# Patient Record
Sex: Female | Born: 1960 | Race: Black or African American | Hispanic: No | Marital: Single | Smoking: Former smoker
Health system: Southern US, Community
[De-identification: ages and names within clinical notes are randomized; demographics above are authoritative.]

## PROBLEM LIST (undated history)

## (undated) DIAGNOSIS — J45909 Unspecified asthma, uncomplicated: Secondary | ICD-10-CM

## (undated) DIAGNOSIS — I10 Essential (primary) hypertension: Secondary | ICD-10-CM

## (undated) DIAGNOSIS — K759 Inflammatory liver disease, unspecified: Secondary | ICD-10-CM

## (undated) DIAGNOSIS — M1990 Unspecified osteoarthritis, unspecified site: Secondary | ICD-10-CM

## (undated) HISTORY — DX: Essential (primary) hypertension: I10

## (undated) HISTORY — DX: Inflammatory liver disease, unspecified: K75.9

## (undated) HISTORY — DX: Unspecified osteoarthritis, unspecified site: M19.90

---

## 1999-11-23 ENCOUNTER — Encounter: Payer: Self-pay | Admitting: Emergency Medicine

## 1999-11-23 ENCOUNTER — Emergency Department (HOSPITAL_COMMUNITY): Admission: EM | Admit: 1999-11-23 | Discharge: 1999-11-23 | Payer: Self-pay | Admitting: Emergency Medicine

## 1999-11-26 ENCOUNTER — Ambulatory Visit (HOSPITAL_COMMUNITY): Admission: RE | Admit: 1999-11-26 | Discharge: 1999-11-26 | Payer: Self-pay | Admitting: Emergency Medicine

## 1999-11-26 ENCOUNTER — Encounter: Payer: Self-pay | Admitting: Emergency Medicine

## 1999-12-19 ENCOUNTER — Encounter: Admission: RE | Admit: 1999-12-19 | Discharge: 2000-03-18 | Payer: Self-pay | Admitting: Neurosurgery

## 2000-11-12 ENCOUNTER — Ambulatory Visit (HOSPITAL_COMMUNITY): Admission: RE | Admit: 2000-11-12 | Discharge: 2000-11-12 | Payer: Self-pay | Admitting: Orthopedic Surgery

## 2000-11-12 ENCOUNTER — Encounter: Payer: Self-pay | Admitting: Orthopedic Surgery

## 2001-06-01 ENCOUNTER — Encounter: Admission: RE | Admit: 2001-06-01 | Discharge: 2001-06-01 | Payer: Self-pay | Admitting: Internal Medicine

## 2001-12-11 ENCOUNTER — Ambulatory Visit (HOSPITAL_COMMUNITY): Admission: RE | Admit: 2001-12-11 | Discharge: 2001-12-11 | Payer: Self-pay | Admitting: Orthopedic Surgery

## 2001-12-11 ENCOUNTER — Encounter: Payer: Self-pay | Admitting: Orthopedic Surgery

## 2002-01-26 ENCOUNTER — Encounter: Admission: RE | Admit: 2002-01-26 | Discharge: 2002-01-26 | Payer: Self-pay | Admitting: Orthopedic Surgery

## 2002-01-26 ENCOUNTER — Encounter: Payer: Self-pay | Admitting: Orthopedic Surgery

## 2002-02-17 ENCOUNTER — Encounter: Admission: RE | Admit: 2002-02-17 | Discharge: 2002-02-17 | Payer: Self-pay | Admitting: Orthopedic Surgery

## 2002-02-17 ENCOUNTER — Encounter: Payer: Self-pay | Admitting: Orthopedic Surgery

## 2002-04-30 ENCOUNTER — Inpatient Hospital Stay (HOSPITAL_COMMUNITY): Admission: RE | Admit: 2002-04-30 | Discharge: 2002-05-01 | Payer: Self-pay | Admitting: Orthopaedic Surgery

## 2002-11-28 ENCOUNTER — Emergency Department (HOSPITAL_COMMUNITY): Admission: EM | Admit: 2002-11-28 | Discharge: 2002-11-28 | Payer: Self-pay | Admitting: *Deleted

## 2002-12-14 ENCOUNTER — Ambulatory Visit (HOSPITAL_COMMUNITY): Admission: RE | Admit: 2002-12-14 | Discharge: 2002-12-14 | Payer: Self-pay | Admitting: Orthopaedic Surgery

## 2003-01-28 ENCOUNTER — Ambulatory Visit (HOSPITAL_COMMUNITY): Admission: RE | Admit: 2003-01-28 | Discharge: 2003-01-29 | Payer: Self-pay | Admitting: Orthopaedic Surgery

## 2005-05-13 ENCOUNTER — Inpatient Hospital Stay (HOSPITAL_COMMUNITY): Admission: RE | Admit: 2005-05-13 | Discharge: 2005-05-16 | Payer: Self-pay | Admitting: Orthopaedic Surgery

## 2006-01-17 ENCOUNTER — Ambulatory Visit: Payer: Self-pay | Admitting: *Deleted

## 2006-01-17 ENCOUNTER — Inpatient Hospital Stay (HOSPITAL_COMMUNITY): Admission: AD | Admit: 2006-01-17 | Discharge: 2006-01-23 | Payer: Self-pay | Admitting: *Deleted

## 2006-05-09 ENCOUNTER — Inpatient Hospital Stay (HOSPITAL_COMMUNITY): Admission: RE | Admit: 2006-05-09 | Discharge: 2006-05-11 | Payer: Self-pay | Admitting: Orthopaedic Surgery

## 2006-08-02 ENCOUNTER — Encounter: Admission: RE | Admit: 2006-08-02 | Discharge: 2006-08-02 | Payer: Self-pay | Admitting: Orthopaedic Surgery

## 2006-09-29 ENCOUNTER — Encounter: Admission: RE | Admit: 2006-09-29 | Discharge: 2006-09-29 | Payer: Self-pay | Admitting: Family Medicine

## 2006-10-08 ENCOUNTER — Encounter: Admission: RE | Admit: 2006-10-08 | Discharge: 2006-10-08 | Payer: Self-pay | Admitting: Family Medicine

## 2006-10-10 ENCOUNTER — Encounter (INDEPENDENT_AMBULATORY_CARE_PROVIDER_SITE_OTHER): Payer: Self-pay | Admitting: Diagnostic Radiology

## 2006-10-10 ENCOUNTER — Encounter: Admission: RE | Admit: 2006-10-10 | Discharge: 2006-10-10 | Payer: Self-pay | Admitting: Family Medicine

## 2006-10-14 HISTORY — PX: BREAST BIOPSY: SHX20

## 2006-12-15 ENCOUNTER — Encounter (INDEPENDENT_AMBULATORY_CARE_PROVIDER_SITE_OTHER): Payer: Self-pay | Admitting: Obstetrics and Gynecology

## 2006-12-15 ENCOUNTER — Ambulatory Visit (HOSPITAL_COMMUNITY): Admission: RE | Admit: 2006-12-15 | Discharge: 2006-12-16 | Payer: Self-pay | Admitting: Obstetrics and Gynecology

## 2008-06-13 ENCOUNTER — Emergency Department (HOSPITAL_COMMUNITY): Admission: EM | Admit: 2008-06-13 | Discharge: 2008-06-13 | Payer: Self-pay | Admitting: Emergency Medicine

## 2010-05-29 NOTE — Op Note (Signed)
Michele [name], Michele [name]              ACCOUNT NO.:  [id number]   MEDICAL RECORD NO.:  [id number]          PATIENT TYPE:  OIB   LOCATION:  9303                          FACILITY:  [facility]   PHYSICIAN:  Naima A. Dillard, M.D. DATE OF BIRTH:  06-26-1960   DATE OF PROCEDURE:  12/15/2006  DATE OF DISCHARGE:                               OPERATIVE REPORT   PREOPERATIVE DIAGNOSIS:  Menorrhagia and fibroids.   POSTOPERATIVE DIAGNOSIS:  Menorrhagia and fibroids.   PROCEDURE:  Total laparoscopic hysterectomy, lysis of adhesions, and a  cystoscopy.   SURGEON:  Naima A. Normand Sloop, M.D.   ASSISTANT:  Osborn Coho, M.D.   ANESTHESIA:  General and local.   FINDINGS:  10 weeks size fibroid uterus with posterior sigmoid and  uterine adhesions, bilateral ovarian sidewall adhesions.  The left  ovarian uterine adhesions.   SPECIMENS:  Right tube and ovary.  Uterus was 234 grams.  They were sent  to pathology.   ESTIMATED BLOOD LOSS:  250 mL.   URINE OUTPUT:  350 mL.   IV FLUIDS:  3 L.   COMPLICATIONS:  None.  The patient to recovery room in stable condition.   PROCEDURE IN DETAIL:  The patient taken to the operating room where she  was placed in dorsal lithotomy position first because of her history of  back and knee surgery, put to sleep with general anesthesia, prepped and  draped in a normal sterile fashion.  A Foley catheter was placed.  The  patient was prepped and draped in normal sterile fashion.  5 mL of 0.25%  Marcaine was placed in the infraumbilical fold.  The 10mm incision was  made with a scalpel in the infraumbilical fold.  The fascia was incised  in midline, extended bilaterally.  We were inside the peritoneum.  The  fascia was circumscribed with a pursestring of 0 Vicryl and the Hasson  was placed into the abdominal cavity.  Intra-abdominal insufflation was  done with CO2 gas.  Attention was then turned to the left lower quadrant  where a 10 mm incision was made after 5 mL of  Marcaine was injected and  10 mm trocar was placed under direct visualization with the laparoscope  away from the inferior epigastric vessels. Another 5 mm trocar was  placed in the right lower quadrant, using the same maneuvers and 3%  Marcaine.  Hemostasis was assured.  Because of the patient's size of the  uterus and the findings a 5 mm trocar port was placed in the suprapubic  area.  The findings were as follows.  The liver appeared normal.  Gallbladder was normal.  Normal bowel.  The left ovary and tube was  adherent to the uterus. There were bilateral hydrosapinx.  The sigmoid  was adherent to the posterior wall of the uterus.  There was a  pedunculated fibroid cyst in the midline of the uterus.  There was a  large pedunculated fibroid to the left side of the uterus and the  patient's right fallopian tube and ovary was adherent to the pelvic  sidewall.  The right ovary was normal.  The  right tube which was cystic  and nonpatent.  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  There were adhesions along the whole area posteriorly.  Anteriorly was  no adhesions noted and normal vesicouterine reflection.  Attention was  then turned to the adhesions on the posterior wall.  The uterine  adhesions were taken down both with a laparoscopic peanut and Metzenbaum  scissors.  Hemostasis was assured.  The left ovary was taken off of the  uterus and the side wall using the peanut and sharp dissection with  scissors.  Hemostasis was assured.  The ureter was seen to be  peristaltic without any problems.  Utero-ovarian ligaments was  cauterized and cut using harmonic scalpel.  The round ligament was  cauterized and cut using harmonic scalpel.  The bladder was grasped with  a harmonic scalpel and the bladder flap was grasped with a harmonic  scalpel and vesicouterine peritoneum was identified, tented up and a  bladder flap was created with the harmonic scalpel to the midline.  The  patient  had a fibroid just in the left side of the broad ligament and  just below it was the uterine artery but it was above the [place] ring so  the uterine artery was grasped with the harmonic scalpel and cauterized  and cut.  Hemostasis was assured. Attention was then turned to the right  side in which the more adhesions were taken from the ovary, taken off of  the uterus from the sigmoid colon to the uterus.  The utero-ovarian  ligament was cauterized and cut using harmonic scalpel.  The bladder  flap was then created and vesicouterine peritoneum was identified,  tented up and entered sharply and taken to the midline to meet the other  area of peritoneum.  The bladder was moved off of the cervical fascia  and you could feel the ring without difficulty.  Attention was then  turned to the back of the uterus where after the utero-ovarian ligament,  the right uterine vessel was identified and cauterized and cut inside  the [facility] ring.  Hemostasis assured.  Then we then did a myomectomy in the  back of the uterus because it was in the way of where the [place] ring would  be. Then took the harmonic scalpel and went around and inside the [place]  ring and circumscribed the incision around the [place] ring.  The uterus  could not be removed through the vagina because of the size so another  myomectomy was done on the uterus.  Once that was done all the fibroids  were removed.  The patient's right uterine ovarian ligament the ovary  was actually adherent to the side wall and through sharp and blunt  dissection we took the ovary tube off of the side wall.  2-0 Vicryl ties  were placed on the infundibulopelvic ligament after the ureter was  visualized to be peristaltic and using Harmonic scalpel, the utero-  ovarian ligament was cauterized and cut.  Hemostasis was assured.  The  tube and ovary were removed through the vagina.  An angle suture with 0  PDS was placed at both angles with interrupted suture and three other   sutures were placed along the cuff.  Hemostasis was assured.  Attention  was then turned to the bladder where cystoscopy was done.  Indigo  carmine was given to the patient.  Both the ureters were seen to be  patent and peristaltic without difficulty.  The bladder  had great  integrity.  No stitches were seen coming through the bladder.  The  cystoscope was removed from the bladder then looked at inspected the  vaginal cuff.  There was a little area in the midline which had some  little bit of oozing.  A figure-of-eight 0 Vicryl stitch was placed in  this area and it was made hemostatic.  Attention was then turned back up  into the pelvic cavity.  Irrigation was done.  All pedicles were noted  to be hemostatic.  All instruments were removed without difficulty.  The  fascia was reapproximated in the infraumbilical port.  All other ports  were closed with Dermabond.  The left lower quadrant port fascia was  attempted to be reapproximated though it was very deep into the abdomen  and was difficult to grasp the fascia entirely.  All skin incisions were  closed with Dermabond.  The infraumbilical incision was also closed and  in subcuticular fashion with 3-0 Vicryl.  Sponge, lap and needle counts  were correct.  The patient went to recovery room in stable condition.  This was a difficult case because of the dissection of adhesions which  took probably about an hour or an hour and a half to do to avoid injury  to the bowel.      Naima A. Normand Sloop, M.D.  Electronically Signed     NAD/MEDQ  D:  12/15/2006  T:  12/15/2006  Job:  [id number]

## 2010-05-29 NOTE — H&P (Signed)
Michele [name], Michele [name]              ACCOUNT NO.:  [id number]   MEDICAL RECORD NO.:  [id number]          PATIENT TYPE:  AMB   LOCATION:  SDC                           FACILITY:  [facility]   PHYSICIAN:  Naima A. Dillard, M.D. DATE OF BIRTH:  1960-12-16   DATE OF ADMISSION:  DATE OF DISCHARGE:                              HISTORY & PHYSICAL   CHIEF COMPLAINT:  Symptomatic fibroids.   HISTORY OF PRESENT ILLNESS:  The patient is a 50 year old gravida 1,  para 0 who presented in September complaining of bleeding starting July  through September.  Some days would be heavy, some days would have  spotting, and some days she would see clotting.  The patient denied  being on any contraception.  She does have a history of fibroid and is  not on any hormone therapy.  She did have some mood swings, hot flushes,  and vaginal dryness.  She denied any abdominal pain.  She did have some  increased stress when she lost her partner in April of 2008.  The  patient was seen by her primary care doctor and had a Pap smear which  the patient reports as normal.  She had a hemoglobin measuring 13.6 and  she reports that her thyroid level was also normal.  The patient had an  ultrasound which was significant for fibroids and ovarian cysts.  She  then came to [place] for follow-up.  On ultrasound in our office the uterus  measures 8.4 x 6.8 x 8.6.  She has several fibroids, the largest being  6.3 cm which is anterior less than pedunculated.  She has a 2.9 cm  posterior pedunculated fibroid and a 2.8 cm posterior fibroid.  She had  a right ovarian cyst measuring 3.4 x 2.5 x 3.1 on an ultrasound that was  done in October of 2008.  On ultrasound done September 29, 2006,  multiple fibroids which was similar to our ultrasound and a mildly  complex right ovarian cyst.  The patient also had an endometrial biopsy  done on October 06, 2006, which showed benign disordered proliferative  endometrium.  The patient was given all  options for irregular  perimenopausal bleeding and treatment of fibroids and she has elected to  proceed with hysterectomy.   PAST MEDICAL HISTORY:  1. Asthma.  2. Depression.  3. As above.   MEDICATIONS:  1. Neurontin.  2. Prozac.  3. Albuterol.  4. Symbicort.  5. Advair.  6. Zantac.  7. Tylenol arthritis.  8. Nabumetone.   PAST SURGICAL HISTORY:  Significant for bilateral knee replacements,  back and ankle surgery.   ALLERGIES:  No known drug allergies.   SOCIAL HISTORY:  The patient does smoke about 3-5 cigarettes a day for  the last two years.  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  She said this is recreational use and does not stop her from  her normal activities.   FAMILY HISTORY:  Significant for a mother with high blood pressure and  diabetes.   PAST OBSTETRICAL HISTORY:  Significant for elective abortion x1.   REVIEW OF SYSTEMS:  RESPIRATORY:  Significant for asthma.  PSYCHIATRIC:  Significant for history of depression.  She denies any suicidal or  homicidal ideations.  MUSCULOSKELETAL:  No weakness.  ENDOCRINE:  No  thyroid disease.  GASTROINTESTINAL:  Significant for GERD.   PHYSICAL EXAMINATION:  VITAL SIGNS:  The patient weighs 215 pounds,  blood pressure 130/80, she is 5 feet 7 inches.  HEENT:  Pupils are equal, hearing is normal, throat is clear.  Thyroid  is not enlarged.  HEART:  Regular rate and rhythm.  LUNGS:  Clear to auscultation bilaterally.  BACK:  No CVA tenderness bilaterally.  ABDOMEN:  Nontender without any masses or organomegaly.  EXTREMITIES:  No cyanosis, clubbing, or edema.  NEUROLOGY:  Within normal limits.  PELVIC:  Vulvar and vaginal exams are within normal limits.  Cervix is  nontender without any lesions.  Uterus is about 9 weeks size, irregular  in consistency, but nontender.  Adnexa has no masses palpated.   ASSESSMENT:   Menorrhagia and ovarian cyst on the right side.  Endometrial biopsy was found to be normal.  All treatments were reviewed  with the patient which are including, but not limited to, observation,  ablation, D&C, hysteroscopy, hormonal treatment, and hysterectomy.  The  patient has decided on hysterectomy and plans to do a total laparoscopic  hysterectomy and removal of right ovary, and remove the left ovary if  there is an issue.  The ovarian cyst did not appear to be complex in  nature.  The patient understands there is a small chance of ovarian  cancer because of the cyst, but she denied a GYN/oncology evaluation.  The patient stated that she would have to have a second surgery if  cancer is found.  She understands the risks of the surgery is bleeding,  infection, damage to internal organs such as bowel, bladder, major blood  vessels.  The patient does plan to keep her left ovary unless it appears  abnormal.  Her mother and her were both present when she signed the  consent and she was lucid and could understand everything.      Naima A. Normand Sloop, M.D.  Electronically Signed     NAD/MEDQ  D:  12/14/2006  T:  12/15/2006  Job:  [id number]

## 2010-06-01 NOTE — Op Note (Signed)
NAMEROYLENE, HEATON NO.:  [id number]   MEDICAL RECORD NO.:  [id number]          PATIENT TYPE:  INP   LOCATION:  5038                         FACILITY:  [facility]   PHYSICIAN:  Mark C. Ophelia Charter, M.D.    DATE OF BIRTH:  Jun 19, 1960   DATE OF PROCEDURE:  05/13/2005  DATE OF DISCHARGE:                                 OPERATIVE REPORT   PREOPERATIVE DIAGNOSIS:  Right knee osteoarthritis.   POSTOPERATIVE DIAGNOSIS:  Right knee osteoarthritis.   PROCEDURE:  Right total knee arthroplasty, cemented.   SURGEON:  Mark C. Ophelia Charter, M.D.   ASSISTANT:  Vanita Panda. Magnus Ivan, M.D.   ANESTHESIA:  GOT plus preoperative femoral nerve block and postoperative  Marcaine, skin, local.   TOURNIQUET TIME:  One hour, 45 minutes.   DRAINS:  None.   BRIEF HISTORY:  This 50 year old female has osteoarthritis of her knee.  Has  some recurvatum with increased posterior slope as well as some malalignment  from double segmental tibial fracture, which has healed, with reasonable  overall alignment except for the increased posterior slope.  She has  tricompartmental degenerative changes with marginal osteophytes, subchondral  cysts, and sclerosis.  She has failed conservative treatment and injections.  Dr. Eliberto Ivory assistance was necessary due to the complexity of the  alignment of the tibia with computer assistance in order to insure proper  hip, knee, and ankle positioning of this __________ total knee due to the  preexisting deformity from the healed tibial fractures.   PROCEDURE:  After standard prepping and draping with preoperative Ancef  prophylaxis and proximal thigh tourniquet, sterile skin marker, Betadine  Viadrape, esmarch wrapping, and inflation of the tourniquet, a midline  incision was made.  Superficial retinaculum was developed, and the quad  tendon was split between the middle one-third and lateral two-thirds.  The  patella was flipped over and cut initially from facet to  facet with  oscillating saw.  Measured 35 mm.  The computer was used after resection of  cruciate ligaments and menisci with initial mapping.  Once the information  was obtained, it seemed like a significantly large tibial cut was being  recommended.  The patient did have 12-13 degree flexion contracture and was  in valgus and would correct within 4 degrees of neutral.  Because the  computer recommended such a large tibial cut, it was elected to repeat the  initiation and confirmation of positions, and the entire process was  repeated with the computer, and again, it suggested a significant cut.  This  appeared to be due to the increased posterior slope from the anterior bowing  from the proximal fragment that had healed with an anterior bow.  This made  the anterior portion of the tibial cut significantly more; however, when the  cut was made, it came out with a 1 mm posterior cortical cut, which was  appropriate.  Alignment looked good.  Sizing was for a #3, and the  sequential cuts were made in the femur using the standard computer technique  with flexion and extension balancing.  A #3 femur, #3 tibia was selected.  Opposition had less than 1 degree valgus and less than 0.5 degrees anterior  posterior slope difference.  Large posterior osteophytes were removed, and  the remaining PCL was resected.  Posterior osteophytes were taken off the  femur, and then trials were inserted again.  This time, the patient would  come all the way out to full extension and actually 2 degrees of recurvatum.  Laxity was balanced in both flexion and extension with varus and valgus  testing.  Cemented was vacuum-mixed.  Components were cemented, the tibia  first, followed by a femur.  A 10 mm spacer, which was the one that had been  trialed in the patellar component.  There was good fit of all components.  Excessive cement was removed.  Identical computer numbers were made after  the component was cemented  in.  The two tibial pins, which were percutaneous  and two femoral pins, which were inside the skin incision were then removed,  backed out with a drill.  Tourniquet was deflated.  Hemostasis was obtained.  The deep retinaculum closed with Tycron suture, #1, 2-0 Vicryl in the  subcutaneous tissue, skin stapled closure, Marcaine infiltration, postop  dressing, and knee immobilizer.  Instrument count and needle count was  correct.      Mark C. Ophelia Charter, M.D.  Electronically Signed     MCY/MEDQ  D:  05/13/2005  T:  05/14/2005  Job:  [id number]

## 2010-06-01 NOTE — Discharge Summary (Signed)
[name], [name] NO.:  [id number]   MEDICAL RECORD NO.:  [id number]          PATIENT TYPE:  INP   LOCATION:  5038                         FACILITY:  [facility]   PHYSICIAN:  Mark C. Ophelia Charter, M.D.    DATE OF BIRTH:  06-14-1960   DATE OF ADMISSION:  05/13/2005  DATE OF DISCHARGE:  05/16/2005                                 DISCHARGE SUMMARY   FINAL DIAGNOSIS:  Right knee osteoarthritis.   PROCEDURE:  Right total knee arthroplasty.   ADDITIONAL DIAGNOSES:  Obesity, esophageal reflux, tobacco use, asthma.   The patient was admitted after informed consent. She had 12-degree valgus  with previous old tibial fractures with segmental tibial fracture with a  proximal one in valgus and a distal one in varus but actually satisfactory  overall alignment. Computer assist was used for total knee arthroplasty and  Dr. Magnus Ivan assisted in the surgery due to the complex nature of her  deformity and the importance of having appropriate alignment to prevent  failure. Cemented total knee arthroplasty was performed using the computer  without complications. The position was excellent. Hemoglobin postop was  10.3. She was on IV PCA pain control, seen by OT, PT, pharmacy for anti DVT  Coumadin prophylaxis and made good progress with physical therapy. CPM was  used. She was taking Tylox at discharge and placed on Coumadin 2.5 mg x2  weeks and then stop. She was voiding on her own and had a bowel movement  prior to discharge. Postoperative x-rays showed good position and alignment.      Mark C. Ophelia Charter, M.D.  Electronically Signed     MCY/MEDQ  D:  06/16/2005  T:  06/17/2005  Job:  [id number]
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
## 2010-06-01 NOTE — Op Note (Signed)
Michele [name], Michele [name] NO.:  [id number]   MEDICAL RECORD NO.:  [id number]          PATIENT TYPE:  INP   LOCATION:  2899                         FACILITY:  [facility]   PHYSICIAN:  Mark C. Ophelia Charter, M.D.    DATE OF BIRTH:  [birth year]   DATE OF PROCEDURE:  DATE OF DISCHARGE:                               OPERATIVE REPORT   PREOPERATIVE DIAGNOSIS:  Left knee osteoarthritis.   POSTOPERATIVE DIAGNOSIS:  Left knee osteoarthritis.   PROCEDURE:  Left total knee arthroplasty.   SURGEON:  Mark C. Ophelia Charter, , MD   ANESTHESIA:  GOT.   ESTIMATED BLOOD LOSS:  100 mL.   TOURNIQUET TIME:  Less than an hour and a half.   ASSISTANT:  Maud Deed, PA-C.   COMPONENTS:  DePuy rotating platform cruciate-substituting 2.5 femur,  2.5 tibia, 10 mm rotating platform, 35 mm dome patella.   PROCEDURE:  After induction of general anesthesia, orotracheal  intubation, standard prep and draping, the usual total knee drape  sheets, sterile skin marker, Betadine Vi-Drape was applied.  The lateral  post was used and a heel bump.  A time-out was taken, preoperative Ancef  was given.  The leg was wrapped in an Esmarch prior to tourniquet  inflation.  A midline incision was made, superficial retinaculum was  developed, true retinaculum was divided, splitting the quad tendon  between the lateral one-third and the medial one-third.  The patella was  flipped over, cut from facet to facet with an oscillating saw, removing  9.5 mm of bone.  Initiation of a computer was performed making the  tibial and then the femoral model.  Initially 3 mm was removed off the  femur, which turned out was not enough and an additional 2 mm was taken  later a for total of 10.  Cuts were made, chamfer cuts but not the box  cut.  The tibia was sized to 2.5 and the patient had an old tibial  plateau fracture, which was medial, had medial collapse and had some  medial spurs.  The spurs were removed, which helped some of the  medial  tightness.  The medial collateral ligament had to be stripped some and  once cuts were made on the tibia and trials inserted with 10-mm spacers,  1 degree of varus, some additional loosening of the medial collateral  ligament and removal of some remaining medial spurs allowed full  extension, good balance and no varus deformity.  There were symmetrical  gaps of flexion/extension.  After pulsatile lavage, cement was vacuum-  mixed.  The tibia was cemented first, followed by the femur, then the  patella, and the poly was inserted.  All excessive cement was removed.  Once the cement was  hardened the tourniquet was deflated and hemostasis obtained and then a  standard layered closure with nonabsorbable in the deep retinaculum, 2-0  on the subcutaneous tissue, and skin closure.  Instrument count and  needle count was correct.      Mark C. Ophelia Charter, M.D.  Electronically Signed     MCY/MEDQ  D:  05/09/2006  T:  05/09/2006  Job:  [id number]

## 2010-06-01 NOTE — Op Note (Signed)
NAME:  Michele [name], Michele [name]                        ACCOUNT NO.:  [id number]   MEDICAL RECORD NO.:  [id number]                   PATIENT TYPE:  OIB   LOCATION:  2550                                 FACILITY:  [facility]   PHYSICIAN:  Mark C. Ophelia Charter, M.D.                 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   PROCEDURE:  C5-C6 and C6-C7 anterior cervical discectomy and fusion with  left iliac crest bone graft.   SURGEON:  Mark C. Ophelia Charter, M.D.   ASSISTANT:  Worthy Flank, RN   ANESTHESIA:  GOT.   ESTIMATED BLOOD LOSS:  100 mL.   PROCEDURE:  After induction of general anesthesia and oral endotracheal  intubation, head halter traction application, sandbag behind the neck, IV  bag wrapped with a green towel underneath the left buttocks, iliac crest and  neck were prepped with DuraPrep, the area spread with towels, sterile skin  marker of the neck in a prominent skin fold crease just above the C6 level  with Betadine Vi-Drape application.  Sterile Mayo stand at the head, thyroid  sheet applied.  The incision was started in the midline and extended to the  left.  She did not have a prominent fold exactly over the C6 level which was  localized by palpation of the carotid tubercle and cricothyroid cartilage.  The platysma was divided in line with the fibers.  Blunt dissection down to  the level of the longus colli was performed with the carotid sheath lateral.  A large spur was noted at C5-C6.  Cross table lateral x-ray confirmed that  this was the 5-6 level.  Self-retaining Cloward retractors were placed,  teeth blades right and left, smooth blades up and down.  Discectomy was  performed with a 15  blade, pituitary, Cloward curets were used to strip the  gutters.  The operating microscope was draped and brought in.  After  drilling progressively back to the posterior cortex with a 12 mm drill off  setting the hole slightly superiorly and to the left side, the operating  microscope was used for removal of the spurs using 1 and 2 mm Kerrisons,  take down of the posterior longitudinal ligament, exposure of the dura, and  decompression.  There were some prominent spondylitic spurs, mostly off to  the right, centrally there were minimal spurs, and there were some disc  material that was retained by the posterior longitudinal ligament but no  extruded fragments were found after the posterior longitudinal ligament was  taken down and the dura was visualized.  Probing behind the vertebral body  was performed.  After irrigation with  saline solution, the 14 mm plug was  harvested from the left iliac crest splitting the fascia in line with the  fibers.  A depth gauge was used and the plug hole was 15 mm to 18 mm deep.  The plug was bullet nosed and impacted with head halter traction applied by  the CRNA and then hand traction was released.  The anterior portion of the  plug was flush with the anterior cortex and was tight.  Next, an identical  procedure was repeated at the C6-C7 level, off setting the plug slightly  inferior and to the right side which is where the patient had compression  right paracentral.  There was a bulging disc retained by the ligament found  at this level and the operating microscope was brought in again.  There were  some large spurs laterally on the right, as well.  The spurs were removed.  The uncovertebral joints were stripped.  There was some bleeding laterally  from an epidural vein and a piece of Surgicel was placed over it for a few  minutes and then this was removed with the operative field dry.  The  uncovertebral joints were stripped and a bone plug was  fashioned.  This plug  was only 8 mm thick, it was counter sunk 2 mm, and fit tightly.  The neck  was rotated and there was no motion.  After irrigation with saline solution,  Hemovac was placed in line with the skin incision.  The platysma was closed  with 3-0 Vicryl, 4-0 Vicryl subcuticular skin closure, tincture of Benzoin,  Steri-Strips, Marcaine infiltration, and skin dressing.  The iliac crest was  closed with 0 Vicryl and 2-0 Vicryl in the subcutaneous tissues, skin staple  closure, Marcaine infiltration, and a postop dressing.  A soft cervical  collar was applied.  Instrument counts and needle counts were correct.                                               Mark C. Ophelia Charter, M.D.    MCY/MEDQ  D:  01/28/2003  T:  01/28/2003  Job:  [id number]
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
## 2010-06-01 NOTE — Op Note (Signed)
   NAME:  Michele [name], Michele [name]                        ACCOUNT NO.:  [id number]   MEDICAL RECORD NO.:  [id number]                   PATIENT TYPE:  INP   LOCATION:  5038                                 FACILITY:  [facility]   PHYSICIAN:  Mark C. Ophelia Charter, M.D.                 DATE OF BIRTH:  1960/04/02   DATE OF PROCEDURE:  04/30/2002  DATE OF DISCHARGE:                                 OPERATIVE REPORT   PREOPERATIVE DIAGNOSIS:  Right L5-S1 herniated nucleus pulposus.   POSTOPERATIVE DIAGNOSIS:  Right L5-S1 herniated nucleus pulposus.   PROCEDURE:  Right L5 laminotomy, L5-S1 microdiskectomy.   SURGEON:  Mark C. Ophelia Charter, M.D.   ASSISTANT:  Genene Churn. Denton Meek.   ANESTHESIA:  General.   ESTIMATED BLOOD LOSS:  Minimal.   DESCRIPTION OF PROCEDURE:  After induction of general anesthesia and  orotracheal intubation, the patient was placed on the Brent frame.  The  back was prepped with Duraprep.  The area was squared with towels, a  Betadine Vi-Drape applied, and laminectomy sheets and drapes.  A Betadine Vi-  Drape was applied and needle-localization crossable lateral x-ray was  performed with the needle at the L5-S1 interspace.  An incision was made a  few millimeters right of the midline.  A Cobb was used for subperiosteal  dissection, a Taylor retractor placed lateral.  Laminotomy was performed,  ligamentum was incised, a patty was used to protect the dura, and the  ligament was removed.  The foramen was enlarged.  There was a firm disk,  which was protruding above the level of bone 4-5 mm.  The annulus was  incised, and there was degenerative nucleus material just below this to the  midline and extending to the right side.  Passes were made with the  straight, up, and down pituitaries.  Once the disk was decompressed, the  nerve root was no longer displaced.  A hockey stick was used for palpation  off the foramina, and the nerve root was freed. The disk was irrigated with  saline solution.   The fascia was closed with 0 Vicryl, 2-0 Vicryl in the  subcutaneous tissue, skin staple closure, and transferred to the recovery  room in stable condition.  The instrument count and needle count was  correct.                                               Mark C. Ophelia Charter, M.D.    MCY/MEDQ  D:  04/30/2002  T:  04/30/2002  Job:  [id number]

## 2010-06-26 ENCOUNTER — Emergency Department (HOSPITAL_COMMUNITY): Payer: Medicare Other

## 2010-06-26 ENCOUNTER — Emergency Department (HOSPITAL_COMMUNITY)
Admission: EM | Admit: 2010-06-26 | Discharge: 2010-06-26 | Disposition: A | Discharge status: Home / Self Care | Payer: Medicare Other | Attending: Emergency Medicine | Admitting: Emergency Medicine

## 2010-06-26 DIAGNOSIS — S52123A Displaced fracture of head of unspecified radius, initial encounter for closed fracture: Secondary | ICD-10-CM | POA: Insufficient documentation

## 2010-06-26 DIAGNOSIS — M129 Arthropathy, unspecified: Secondary | ICD-10-CM | POA: Insufficient documentation

## 2010-06-26 DIAGNOSIS — M25429 Effusion, unspecified elbow: Secondary | ICD-10-CM | POA: Insufficient documentation

## 2010-06-26 DIAGNOSIS — M25529 Pain in unspecified elbow: Secondary | ICD-10-CM | POA: Insufficient documentation

## 2010-06-26 DIAGNOSIS — W1809XA Striking against other object with subsequent fall, initial encounter: Secondary | ICD-10-CM | POA: Insufficient documentation

## 2010-06-26 DIAGNOSIS — J45909 Unspecified asthma, uncomplicated: Secondary | ICD-10-CM | POA: Insufficient documentation

## 2010-08-31 ENCOUNTER — Emergency Department (HOSPITAL_COMMUNITY)
Admission: EM | Admit: 2010-08-31 | Discharge: 2010-08-31 | Disposition: A | Discharge status: Home / Self Care | Payer: Medicare Other | Attending: Emergency Medicine | Admitting: Emergency Medicine

## 2010-08-31 ENCOUNTER — Emergency Department (HOSPITAL_COMMUNITY): Payer: Medicare Other

## 2010-08-31 DIAGNOSIS — M25579 Pain in unspecified ankle and joints of unspecified foot: Secondary | ICD-10-CM | POA: Insufficient documentation

## 2010-08-31 DIAGNOSIS — W2203XA Walked into furniture, initial encounter: Secondary | ICD-10-CM | POA: Insufficient documentation

## 2010-08-31 DIAGNOSIS — S8990XA Unspecified injury of unspecified lower leg, initial encounter: Secondary | ICD-10-CM | POA: Insufficient documentation

## 2010-08-31 DIAGNOSIS — M129 Arthropathy, unspecified: Secondary | ICD-10-CM | POA: Insufficient documentation

## 2010-08-31 DIAGNOSIS — S92919A Unspecified fracture of unspecified toe(s), initial encounter for closed fracture: Secondary | ICD-10-CM | POA: Insufficient documentation

## 2010-08-31 DIAGNOSIS — M79609 Pain in unspecified limb: Secondary | ICD-10-CM | POA: Insufficient documentation

## 2010-08-31 DIAGNOSIS — Z79899 Other long term (current) drug therapy: Secondary | ICD-10-CM | POA: Insufficient documentation

## 2010-08-31 DIAGNOSIS — M7989 Other specified soft tissue disorders: Secondary | ICD-10-CM | POA: Insufficient documentation

## 2010-08-31 DIAGNOSIS — S99919A Unspecified injury of unspecified ankle, initial encounter: Secondary | ICD-10-CM | POA: Insufficient documentation

## 2010-10-22 LAB — CBC
Hemoglobin: 10.7 — ABNORMAL LOW
MCHC: 34.3
MCV: 97.6
RBC: 3.2 — ABNORMAL LOW

## 2010-10-22 LAB — BASIC METABOLIC PANEL
CO2: 27
Chloride: 104
Creatinine, Ser: 0.66
GFR calc Af Amer: >60
Sodium: 134 — ABNORMAL LOW

## 2010-10-23 LAB — CBC
MCHC: 34.4
RDW: 13.8

## 2010-10-23 LAB — BASIC METABOLIC PANEL
CO2: 27
Calcium: 9.2
Creatinine, Ser: 0.73
Glucose, Bld: 91

## 2010-10-23 LAB — HCG, QUANTITATIVE, PREGNANCY: hCG, Beta Chain, Quant, S: <2

## 2012-03-25 ENCOUNTER — Other Ambulatory Visit: Payer: Self-pay | Admitting: Internal Medicine

## 2012-03-25 DIAGNOSIS — Z1231 Encounter for screening mammogram for malignant neoplasm of breast: Secondary | ICD-10-CM

## 2012-04-27 ENCOUNTER — Ambulatory Visit
Admission: RE | Admit: 2012-04-27 | Discharge: 2012-04-27 | Disposition: A | Discharge status: Home / Self Care | Payer: Medicare Other | Source: Ambulatory Visit | Attending: Internal Medicine | Admitting: Internal Medicine

## 2012-04-27 DIAGNOSIS — Z1231 Encounter for screening mammogram for malignant neoplasm of breast: Secondary | ICD-10-CM

## 2014-03-18 DIAGNOSIS — J4520 Mild intermittent asthma, uncomplicated: Secondary | ICD-10-CM | POA: Diagnosis not present

## 2014-03-18 DIAGNOSIS — M5136 Other intervertebral disc degeneration, lumbar region: Secondary | ICD-10-CM | POA: Diagnosis not present

## 2014-03-18 DIAGNOSIS — Z13220 Encounter for screening for lipoid disorders: Secondary | ICD-10-CM | POA: Diagnosis not present

## 2014-03-18 DIAGNOSIS — J302 Other seasonal allergic rhinitis: Secondary | ICD-10-CM | POA: Diagnosis not present

## 2014-03-18 DIAGNOSIS — R7301 Impaired fasting glucose: Secondary | ICD-10-CM | POA: Diagnosis not present

## 2014-05-16 DIAGNOSIS — R5383 Other fatigue: Secondary | ICD-10-CM | POA: Diagnosis not present

## 2014-05-16 DIAGNOSIS — B182 Chronic viral hepatitis C: Secondary | ICD-10-CM | POA: Diagnosis not present

## 2014-05-16 DIAGNOSIS — R894 Abnormal immunological findings in specimens from other organs, systems and tissues: Secondary | ICD-10-CM | POA: Diagnosis not present

## 2014-05-16 DIAGNOSIS — R748 Abnormal levels of other serum enzymes: Secondary | ICD-10-CM | POA: Diagnosis not present

## 2014-05-17 ENCOUNTER — Other Ambulatory Visit (HOSPITAL_COMMUNITY): Payer: Self-pay | Admitting: Nurse Practitioner

## 2014-05-17 DIAGNOSIS — B182 Chronic viral hepatitis C: Secondary | ICD-10-CM

## 2014-06-08 ENCOUNTER — Ambulatory Visit (HOSPITAL_COMMUNITY)
Admission: RE | Admit: 2014-06-08 | Discharge: 2014-06-08 | Disposition: A | Discharge status: Home / Self Care | Payer: Medicare Other | Source: Ambulatory Visit | Attending: Nurse Practitioner | Admitting: Nurse Practitioner

## 2014-06-08 DIAGNOSIS — B1920 Unspecified viral hepatitis C without hepatic coma: Secondary | ICD-10-CM | POA: Diagnosis not present

## 2014-06-08 DIAGNOSIS — B182 Chronic viral hepatitis C: Secondary | ICD-10-CM

## 2014-06-08 DIAGNOSIS — K7689 Other specified diseases of liver: Secondary | ICD-10-CM | POA: Diagnosis not present

## 2014-06-08 DIAGNOSIS — B1710 Acute hepatitis C without hepatic coma: Secondary | ICD-10-CM | POA: Diagnosis not present

## 2014-06-20 DIAGNOSIS — E669 Obesity, unspecified: Secondary | ICD-10-CM | POA: Diagnosis not present

## 2014-06-20 DIAGNOSIS — M5136 Other intervertebral disc degeneration, lumbar region: Secondary | ICD-10-CM | POA: Diagnosis not present

## 2014-06-20 DIAGNOSIS — J4520 Mild intermittent asthma, uncomplicated: Secondary | ICD-10-CM | POA: Diagnosis not present

## 2014-06-20 DIAGNOSIS — Z1239 Encounter for other screening for malignant neoplasm of breast: Secondary | ICD-10-CM | POA: Diagnosis not present

## 2014-06-20 DIAGNOSIS — R7301 Impaired fasting glucose: Secondary | ICD-10-CM | POA: Diagnosis not present

## 2014-06-21 ENCOUNTER — Other Ambulatory Visit: Payer: Self-pay

## 2014-06-21 DIAGNOSIS — Z1231 Encounter for screening mammogram for malignant neoplasm of breast: Secondary | ICD-10-CM

## 2014-06-28 ENCOUNTER — Ambulatory Visit
Admission: RE | Admit: 2014-06-28 | Discharge: 2014-06-28 | Disposition: A | Discharge status: Home / Self Care | Payer: Medicare Other | Source: Ambulatory Visit

## 2014-06-28 DIAGNOSIS — R928 Other abnormal and inconclusive findings on diagnostic imaging of breast: Secondary | ICD-10-CM | POA: Diagnosis not present

## 2014-06-28 DIAGNOSIS — Z1231 Encounter for screening mammogram for malignant neoplasm of breast: Secondary | ICD-10-CM

## 2014-06-30 ENCOUNTER — Other Ambulatory Visit: Payer: Self-pay

## 2014-06-30 DIAGNOSIS — Z1231 Encounter for screening mammogram for malignant neoplasm of breast: Secondary | ICD-10-CM

## 2014-07-05 ENCOUNTER — Encounter (HOSPITAL_COMMUNITY): Payer: Self-pay | Admitting: Emergency Medicine

## 2014-07-05 ENCOUNTER — Other Ambulatory Visit: Payer: Self-pay

## 2014-07-05 ENCOUNTER — Emergency Department (INDEPENDENT_AMBULATORY_CARE_PROVIDER_SITE_OTHER)
Admission: EM | Admit: 2014-07-05 | Discharge: 2014-07-05 | Disposition: A | Discharge status: Home / Self Care | Payer: Self-pay | Source: Home / Self Care

## 2014-07-05 DIAGNOSIS — K047 Periapical abscess without sinus: Secondary | ICD-10-CM

## 2014-07-05 DIAGNOSIS — I10 Essential (primary) hypertension: Secondary | ICD-10-CM

## 2014-07-05 DIAGNOSIS — Z1231 Encounter for screening mammogram for malignant neoplasm of breast: Secondary | ICD-10-CM

## 2014-07-05 HISTORY — DX: Unspecified asthma, uncomplicated: J45.909

## 2014-07-05 MED ORDER — AMOXICILLIN 500 MG PO CAPS: 500.0000 mg | ORAL_CAPSULE | Freq: Two times a day (BID) | ORAL | Status: DC

## 2014-07-05 MED ORDER — IBUPROFEN 800 MG PO TABS: 800.0000 mg | ORAL_TABLET | Freq: Three times a day (TID) | ORAL | Status: AC | PRN

## 2014-07-05 MED ORDER — BUPIVACAINE-EPINEPHRINE (PF) 0.5% -1:200000 IJ SOLN
INTRAMUSCULAR | Status: AC
  Filled 2014-07-05: qty 1.8

## 2014-07-05 NOTE — ED Provider Notes (Addendum)
CSN: [id number]     Arrival date & time 07/05/14  1439 History   None    Chief Complaint  Patient presents with  . Dental Pain   (Consider location/radiation/quality/duration/timing/severity/associated sxs/prior Treatment) HPI And pain. Started 2 days ago. Constant. Getting worse. Right lower jaw. Several teeth with cavities. No discharge. No fevers. Worse with mastication and hot and cold liquids. Denies fevers, neck stiffness, headache, nausea, vomiting, diarrhea, constipation, chest pain, shortness breath, palpations. On all without the benefit.   History reviewed. No pertinent past medical history. History reviewed. No pertinent past surgical history. No family history on file. History  Substance Use Topics  . Smoking status: Never Smoker   . Smokeless tobacco: Not on file  . Alcohol Use: No   OB History    No data available     Review of Systems Per HPI with all other pertinent systems negative.   Allergies  Review of patient's allergies indicates no known allergies.  Home Medications   Prior to Admission medications   Not on File   BP 165/116 mmHg  Pulse 94  Temp(Src) 99.1 F (37.3 C) (Oral)  Resp 22  SpO2 96% Physical Exam Physical Exam  Constitutional: oriented to person, place, and time. appears well-developed and well-nourished. No distress.  HENT:  Soft tissue swelling of the right mandible around the second and third molars. Numerous dental caries. No fluctuance appreciated. Head: Normocephalic and atraumatic.  Eyes: EOMI. PERRL.  Neck: Normal range of motion.  Cardiovascular: RRR, no m/r/g, 2+ distal pulses,  Pulmonary/Chest: Effort normal and breath sounds normal. No respiratory distress.  Abdominal: Soft. Bowel sounds are normal. NonTTP, no distension.  Musculoskeletal: Normal range of motion. Non ttp, no effusion.  Neurological: alert and oriented to person, place, and time.  Skin: Skin is warm. No rash noted. non diaphoretic.  Psychiatric:  normal mood and affect. behavior is normal. Judgment and thought content normal.   ED Course  Dental Date/Time: 07/05/2014 5:03 PM Performed by: Konrad Dolores, Dava Rensch J Authorized by: Konrad Dolores, Tristyn Demarest J Consent: Verbal consent obtained. Risks and benefits: risks, benefits and alternatives were discussed Consent given by: patient Patient identity confirmed: verbally with patient Local anesthesia used: yes (1.8cc  of Marcaine) Patient sedated: no Patient tolerance: Patient tolerated the procedure well with no immediate complications   (including critical care time) Labs Review Labs Reviewed - No data to display  Imaging Review No results found.   MDM   1. Dental infection    Dental block as above. Start Motrin 800 and amoxicillin. Follow-up with PCP After dental block and improvement in pain, patient's blood pressure was rechecked and found to be 142/90. Patient with other readings in the past of high blood pressure and will follow up with her PCP regarding medication management.      Ozella Rocks, MD 07/05/14 1705  Ozella Rocks, MD 07/05/14 [phone]

## 2014-07-05 NOTE — Discharge Instructions (Signed)
You have a dental infection. Please start the ibuprofen for pain and inflammation. Please start the antibiotics to Clear the infection. Please consider using a twice daily mouthwash. Please follow-up with your dentist for dental extraction.

## 2014-07-05 NOTE — ED Notes (Signed)
C/o right lower dental pain onset 2 days Alert, no signs of acute distress.

## 2014-08-01 DIAGNOSIS — B182 Chronic viral hepatitis C: Secondary | ICD-10-CM | POA: Diagnosis not present

## 2014-10-14 DIAGNOSIS — Z23 Encounter for immunization: Secondary | ICD-10-CM | POA: Diagnosis not present

## 2014-10-14 DIAGNOSIS — J4520 Mild intermittent asthma, uncomplicated: Secondary | ICD-10-CM | POA: Diagnosis not present

## 2014-10-14 DIAGNOSIS — K219 Gastro-esophageal reflux disease without esophagitis: Secondary | ICD-10-CM | POA: Diagnosis not present

## 2014-10-14 DIAGNOSIS — Z124 Encounter for screening for malignant neoplasm of cervix: Secondary | ICD-10-CM | POA: Diagnosis not present

## 2014-10-14 DIAGNOSIS — N760 Acute vaginitis: Secondary | ICD-10-CM | POA: Diagnosis not present

## 2014-10-14 DIAGNOSIS — R7301 Impaired fasting glucose: Secondary | ICD-10-CM | POA: Diagnosis not present

## 2014-12-26 DIAGNOSIS — M179 Osteoarthritis of knee, unspecified: Secondary | ICD-10-CM | POA: Diagnosis not present

## 2014-12-26 DIAGNOSIS — Z23 Encounter for immunization: Secondary | ICD-10-CM | POA: Diagnosis not present

## 2014-12-26 DIAGNOSIS — R7301 Impaired fasting glucose: Secondary | ICD-10-CM | POA: Diagnosis not present

## 2014-12-26 DIAGNOSIS — J4520 Mild intermittent asthma, uncomplicated: Secondary | ICD-10-CM | POA: Diagnosis not present

## 2014-12-26 DIAGNOSIS — M5136 Other intervertebral disc degeneration, lumbar region: Secondary | ICD-10-CM | POA: Diagnosis not present

## 2015-11-23 ENCOUNTER — Other Ambulatory Visit: Payer: Self-pay | Admitting: Internal Medicine

## 2015-11-23 DIAGNOSIS — E2839 Other primary ovarian failure: Secondary | ICD-10-CM

## 2016-09-18 ENCOUNTER — Encounter: Payer: Self-pay | Admitting: Podiatry

## 2016-09-18 ENCOUNTER — Ambulatory Visit (INDEPENDENT_AMBULATORY_CARE_PROVIDER_SITE_OTHER): Payer: Medicare Other | Admitting: Podiatry

## 2016-09-18 ENCOUNTER — Ambulatory Visit (INDEPENDENT_AMBULATORY_CARE_PROVIDER_SITE_OTHER): Payer: Medicare Other

## 2016-09-18 VITALS — BP 151/91 | HR 90 | Resp 18

## 2016-09-18 DIAGNOSIS — M21619 Bunion of unspecified foot: Secondary | ICD-10-CM

## 2016-09-18 DIAGNOSIS — Z720 Tobacco use: Secondary | ICD-10-CM | POA: Diagnosis not present

## 2016-09-18 DIAGNOSIS — M2041 Other hammer toe(s) (acquired), right foot: Secondary | ICD-10-CM | POA: Diagnosis not present

## 2016-09-18 NOTE — Progress Notes (Signed)
   Subjective:    Patient ID: Michele [name], female    DOB: 1960/07/31, 56 y.o.   MRN: [id number]  HPI  Chief Complaint  Patient presents with  . Nail Problem    i cut my right big toenail and it hurts some  . Foot Problem    i have some bunion and hammertoes on my right foot    56 y.o. female presents with the above complaint. Reports painful bunion deformity to her R foot. Has tried shoe gear change without relief. Has not tried padding. Also complains of hammertoes on the same foot that cause her pain.  Past Medical History:  Diagnosis Date  . Asthma    No past surgical history on file.  Current Outpatient Prescriptions:  .  amoxicillin (AMOXIL) 500 MG capsule, Take 1 capsule (500 mg total) by mouth 2 (two) times daily. (Patient not taking: Reported on 09/18/2016), Disp: 20 capsule, Rfl: 0 .  gabapentin (NEURONTIN) 400 MG capsule, , Disp: , Rfl:  .  ibuprofen (ADVIL,MOTRIN) 800 MG tablet, Take 1 tablet (800 mg total) by mouth every 8 (eight) hours as needed., Disp: 30 tablet, Rfl: 0 .  omeprazole (PRILOSEC) 20 MG capsule, , Disp: , Rfl:   No Known Allergies  Review of Systems  All other systems reviewed and are negative.      Objective:   Physical Exam Vitals:   09/18/16 1509  BP: (!) 151/91  Pulse: 90  Resp: 18   General AA&O x3. Normal mood and affect.  Vascular Dorsalis pedis and posterior tibial pulses  present 1+ bilaterally. Capillary refill normal to all digits. Pedal hair growth normal.  Neurologic Epicritic sensation grossly intact.  Dermatologic No open lesions. Interspaces clear of maceration.  Normal skin temperature and turgor. Hyperkeratotic lesions: none bilaterally  Orthopedic: MMT 5/5 in dorsiflexion, plantarflexion, inversion, and eversion. Hallux abductovalgus deformity present Left 1st MPJ full range of motion. Left 1st TMT with gross hypermobility. Right 1st MPJ full range of motion  Right 1st TMT with gross hypermobility. Lesser digital  contractures present bilaterally.   Radiographs: Taken and reviewed. Hallux abductovalgus deformity present. Metatarsal parabola abnormal - long 2nd/3rd mets. 1st/2nd IMA: 13 with underlying 1/2 metatarsus adductus.    Assessment & Plan:  HAV Deformity R Foot -XR reviewed as above. -Discussed that due to severe deformity patient would most benefit from Lapidus bunionectomy.  -Discussed need for NWB post-procedure due to joint fusion. -Educated on smoking cessation. Advised nicotine labs will be checked prior to surgery.  Hammertoes R Foot -Will defer correction until above is met.  30 minutes of face to face time were spent with the patient. >50% of this was spent on counseling and coordination of care. Specifically discussed with patient the possible surgical plan for correcting her bunion, however that I cannot do her procedure until other risk factors are controlled such as smoking. Patient is obese and would need to be NWB post-procedure.

## 2016-11-21 ENCOUNTER — Other Ambulatory Visit: Payer: Self-pay | Admitting: Internal Medicine

## 2016-11-21 DIAGNOSIS — Z139 Encounter for screening, unspecified: Secondary | ICD-10-CM

## 2016-12-12 ENCOUNTER — Other Ambulatory Visit: Payer: Self-pay | Admitting: Nurse Practitioner

## 2016-12-12 DIAGNOSIS — B182 Chronic viral hepatitis C: Secondary | ICD-10-CM

## 2016-12-19 ENCOUNTER — Ambulatory Visit
Admission: RE | Admit: 2016-12-19 | Discharge: 2016-12-19 | Disposition: A | Discharge status: Home / Self Care | Payer: Medicare Other | Source: Ambulatory Visit | Attending: Nurse Practitioner | Admitting: Nurse Practitioner

## 2016-12-19 DIAGNOSIS — B182 Chronic viral hepatitis C: Secondary | ICD-10-CM

## 2016-12-25 ENCOUNTER — Ambulatory Visit
Admission: RE | Admit: 2016-12-25 | Discharge: 2016-12-25 | Disposition: A | Discharge status: Home / Self Care | Payer: Medicare Other | Source: Ambulatory Visit | Attending: Internal Medicine | Admitting: Internal Medicine

## 2016-12-25 DIAGNOSIS — Z139 Encounter for screening, unspecified: Secondary | ICD-10-CM

## 2016-12-31 ENCOUNTER — Ambulatory Visit
Admission: RE | Admit: 2016-12-31 | Discharge: 2016-12-31 | Disposition: A | Discharge status: Home / Self Care | Payer: Medicare Other | Source: Ambulatory Visit | Attending: Nurse Practitioner | Admitting: Nurse Practitioner

## 2017-01-16 NOTE — Progress Notes (Signed)
[facility] - Clinic Note  01/17/2017     CHIEF COMPLAINT Patient presents for Diabetic Eye Exam   HISTORY OF PRESENT ILLNESS: Michele [name] is a 57 y.o. female who presents to the clinic today for:   HPI    Diabetic Eye Exam    Associated Symptoms Floaters and Pain.  Negative for Flashes, Blind Spot, Photophobia, Scalp Tenderness, Fever, Glare, Weight Loss, Distortion, Jaw Claudication, Redness, Trauma, Shoulder/Hip pain and Fatigue.  Diabetes characteristics include controlled with diet.  Blood sugar level is controlled.  Last Blood Glucose 5.7.  I, the attending physician,  performed the HPI with the patient and updated documentation appropriately.          Comments    Pt presents today for DM exam on the referral of Dr. Clydene Pugh for DM exam, pt states she is only borderline diabetic, last A1C was 5.7, pt states A1C was up to 7 about 7-8 months ago, pt does not check BS at home, pt has a lot of floaters and eyes hurt occasionally, pt denies flashes and wavy vision, pt does not take any medication for diabetes, states it is controlled by diet, pts sister sees Dr. Ashley Royalty for exudative ARMD,        Last edited by Rennis Chris, MD on 01/17/2017 11:34 AM. (History)    Pt states that Dr. Harriette Bouillon saw her and sent her to see Dr. Vanessa Barbara; Pt states that she is borderline diabetic, pt reports that she is not on any DM meds, reports she keeps watch of her diet and exercise; Pt reports that she had not had an eye exam in over 20 years prior to being seen by Dr. 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 Heron Sabins for DM exam and concern for pigmentary macular dystrophy;  LEE- 12.17.18 (H.  [place]) [BCVA OD: 20/25 OS: 20/40] Ocular Hx- pigmentary macular dystrophy  PMH- DM, HTN, RA, hep C    CURRENT MEDICATIONS: No current outpatient medications on file. (Ophthalmic Drugs)   No current facility-administered medications for this visit.  (Ophthalmic Drugs)   Current Outpatient Medications (Other)  Medication Sig  . amlodipine-atorvastatin (CADUET) 10-10 MG tablet Take 1 tablet by mouth daily.  . hydrochlorothiazide (HYDRODIURIL) 12.5 MG tablet Take 12.5 mg by mouth daily.  Marland Kitchen lisinopril (PRINIVIL,ZESTRIL) 10 MG tablet Take 10 mg by mouth daily.  Marland Kitchen amoxicillin (AMOXIL) 500 MG capsule Take 1 capsule (500 mg total) by mouth 2 (two) times daily. (Patient not taking: Reported on 09/18/2016)  . gabapentin (NEURONTIN) 400 MG capsule   . ibuprofen (ADVIL,MOTRIN) 800 MG tablet Take 1 tablet (800 mg total) by mouth every 8 (eight) hours as needed. (Patient not taking: Reported on 01/17/2017)  . omeprazole (PRILOSEC) 20 MG capsule    No current facility-administered medications for this visit.  (Other)      REVIEW OF SYSTEMS: ROS    Positive for: Musculoskeletal, Endocrine, Eyes   Last edited by Rennis Chris, MD on 01/18/2017  7:48 PM. (History)       ALLERGIES No Known Allergies  PAST MEDICAL HISTORY Past Medical History:  Diagnosis Date  . Arthritis   . Asthma   . Hepatitis    C  .  Hypertension    Past Surgical History:  Procedure Laterality Date  . BREAST BIOPSY Left 10/14/2006   x2    FAMILY HISTORY Family History  Problem Relation Age of Onset  . Glaucoma Mother   . Cataracts Sister   . Macular degeneration Sister   . Diabetes Sister   . Amblyopia Neg Hx   . Blindness Neg Hx   . Retinal detachment Neg Hx   . Stroke Neg Hx   . Retinitis pigmentosa Neg Hx     SOCIAL HISTORY Social History   Tobacco Use  . Smoking status: Former Smoker    Last attempt to quit: 10/16/2016    Years since quitting: 0.2  . Smokeless tobacco: Never Used  Substance  Use Topics  . Alcohol use: No  . Drug use: No         OPHTHALMIC EXAM:  Base Eye Exam    Visual Acuity (Snellen - Linear)      Right Left   Dist [place] 20/30 -1 20/25 -2   Dist ph [place] 20/25 -1 NI       Tonometry (Tonopen, 8:30 AM)      Right Left   Pressure 17 13       Pupils      Dark Light Shape React APD   Right 3 2 Round Minimal None   Left 3 2 Round Minimal None       Visual Fields (Counting fingers)      Left Right    Full Full       Extraocular Movement      Right Left    Full, Ortho Full, Ortho       Neuro/Psych    Oriented x3:  Yes   Mood/Affect:  Normal       Dilation    Both eyes:  1.0% Mydriacyl, 2.5% Phenylephrine @ 8:57 AM        Slit Lamp and Fundus Exam    Slit Lamp Exam      Right Left   Lids/Lashes Dermatochalasis - upper lid Dermatochalasis - upper lid   Conjunctiva/Sclera Mild Melanosis Nasal Pinguecula   Cornea Inferior 1+ Punctate epithelial erosions 2+ diffuse Punctate epithelial erosions   Anterior Chamber Deep and quiet Deep and quiet   Iris Round and dilated Round and dilated   Lens 2+ Nuclear sclerosis, 2+ Cortical cataract 2+ Nuclear sclerosis, 2+ Cortical cataract   Vitreous Vitreous syneresis Vitreous syneresis       Fundus Exam      Right Left   Disc Normal Normal   C/D Ratio 0.5 0.45   Macula Flat, Good foveal reflex, focal area of pigment clumping just temporal to fovea Good foveal reflex, focal pigment clumping superior and temporal to fovea, Retinal pigment epithelial mottling, Flat, No heme or edema   Vessels Mild Vascular attenuation, Copper wiring, mild AV crossing changes, no vasculitis Mild Vascular attenuation, Copper wiring, mild AV crossing changes, no vasculitis   Periphery Attached Attached        Refraction    Manifest Refraction (Retinoscopy)      Sphere Cylinder Axis Dist VA   Right -0.25 +0.75 012 20/25-2   Left [place] +1.25 155 20/25-2          IMAGING AND PROCEDURES  Imaging and Procedures  for 01/18/17  OCT, Retina - OU - Both Eyes     Right Eye Quality was good. Central Foveal Thickness: 200. Progression has no prior data. Findings include abnormal foveal contour, intraretinal  hyper-reflective material, no IRF, no SRF, outer retinal atrophy.   Left Eye Quality was good. Central Foveal Thickness: 204. Progression has no prior data. Findings include abnormal foveal contour, intraretinal hyper-reflective material, outer retinal atrophy, no IRF, no SRF.   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 CME - cystoid macular edema. PED - pigment epithelial detachment. IRF - intraretinal fluid. SRF - subretinal fluid. EZ - ellipsoid zone. ERM - epiretinal membrane. ORA - outer retinal atrophy. ORT - outer retinal tubulation. SRHM - subretinal hyper-reflective material         Fluorescein Angiography Optos (Transit OS)     Right Eye Progression has no prior data. Early phase findings include blockage, staining. Mid/Late phase findings include blockage, staining.   Left Eye Progression has no prior data. Early phase findings include staining, blockage. Mid/Late phase findings include blockage, staining.   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 Retinal edema H35.81 OCT, Retina - OU - Both Eyes    Fluorescein Angiography Optos (Transit OS)  3. 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 Pigmentary retinal dystrophy OU  - focal area of RPE clumping just temporal to  fovea OU  - FA shows blockage with some mild staining -- no leakage -- OU  - pt is asymptomatic except for complaint of presbyopia  - BCVA is 20/25 OU  - discussed findings  - differential includes macular CHRPE, torpedo maculopathy, pigmentary pattern dystrophy  - pt has not had an eye exam in 30+ years, so unclear how long pigmentary lesions have been present and whether they have undergone any changes over time  - will monitor for now  - Optos color fundus and autofluorescent images obtained today to track progression  - F/U 2 months   2. No retinal edema on exam or OCT  3. Combined form of age related cataract OU- - The symptoms of cataract, surgical options, and treatments and risks were discussed with patient. - discussed diagnosis and progression - not yet visually significant - monitor for now   Ophthalmic Meds Ordered this visit:  No orders of the defined types were placed in this encounter.      Return in about 2 months (around 03/17/2017) for F/U .  There are no Patient Instructions on file for this visit.   Explained the diagnoses, plan, and follow up with the patient and they expressed understanding.  Patient expressed understanding of the importance of proper follow up care.   This document serves as a record of services personally performed by Karie ChimeraBrian G. Tryston Gilliam, MD, PhD. It was created on their behalf by Virgilio BellingMeredith Fabian, COA, a certified ophthalmic assistant. The creation of this record is the provider's dictation and/or activities during the visit.  Electronically signed by: Virgilio BellingMeredith Fabian, COA  01/18/17 8:03 PM    Karie ChimeraBrian G. Marilea Gwynne, M.D., Ph.D. Diseases & Surgery of the Retina and Vitreous [facility] 01/18/17  I have reviewed the above documentation for  accuracy and completeness, and I agree with the above. Karie Chimera, M.D., Ph.D. 01/18/17 8:11 PM     Abbreviations: M myopia (nearsighted); A astigmatism; H hyperopia  (farsighted); P presbyopia; Mrx spectacle prescription;  CTL contact lenses; OD right eye; OS left eye; OU both eyes  XT exotropia; ET esotropia; PEK punctate epithelial keratitis; PEE punctate epithelial erosions; DES dry eye syndrome; MGD meibomian gland dysfunction; ATs artificial tears; PFAT's preservative free artificial tears; NSC nuclear sclerotic cataract; PSC posterior subcapsular cataract; ERM epi-retinal membrane; PVD posterior vitreous detachment; RD retinal detachment; DM diabetes mellitus; DR diabetic retinopathy; NPDR non-proliferative diabetic retinopathy; PDR proliferative diabetic retinopathy; CSME clinically significant macular edema; DME diabetic macular edema; dbh dot blot hemorrhages; CWS cotton wool spot; POAG primary open angle glaucoma; C/D cup-to-disc ratio; HVF humphrey visual field; GVF goldmann visual field; OCT optical coherence tomography; IOP intraocular pressure; BRVO Branch retinal vein occlusion; CRVO central retinal vein occlusion; CRAO central retinal artery occlusion; BRAO branch retinal artery occlusion; RT retinal tear; SB scleral buckle; PPV pars plana vitrectomy; VH Vitreous hemorrhage; PRP panretinal laser photocoagulation; IVK intravitreal kenalog; VMT vitreomacular traction; MH Macular hole;  NVD neovascularization of the disc; NVE neovascularization elsewhere; AREDS age related eye disease study; ARMD age related macular degeneration; POAG primary open angle glaucoma; EBMD epithelial/anterior basement membrane dystrophy; ACIOL anterior chamber intraocular lens; IOL intraocular lens; PCIOL posterior chamber intraocular lens; Phaco/IOL phacoemulsification with intraocular lens placement; PRK photorefractive keratectomy; LASIK laser assisted in situ keratomileusis; HTN hypertension; DM diabetes mellitus; COPD chronic obstructive pulmonary disease

## 2017-01-17 ENCOUNTER — Encounter (INDEPENDENT_AMBULATORY_CARE_PROVIDER_SITE_OTHER): Payer: Self-pay | Admitting: Ophthalmology

## 2017-01-17 ENCOUNTER — Ambulatory Visit (INDEPENDENT_AMBULATORY_CARE_PROVIDER_SITE_OTHER): Payer: Medicare Other | Admitting: Ophthalmology

## 2017-01-17 DIAGNOSIS — H3581 Retinal edema: Secondary | ICD-10-CM

## 2017-01-17 DIAGNOSIS — H25813 Combined forms of age-related cataract, bilateral: Secondary | ICD-10-CM

## 2017-01-17 DIAGNOSIS — H3552 Pigmentary retinal dystrophy: Secondary | ICD-10-CM

## 2017-01-18 ENCOUNTER — Encounter (INDEPENDENT_AMBULATORY_CARE_PROVIDER_SITE_OTHER): Payer: Self-pay | Admitting: Ophthalmology

## 2017-03-13 NOTE — Progress Notes (Signed)
[facility] - Clinic Note  03/17/2017     CHIEF COMPLAINT Patient presents for Retina Follow Up   HISTORY OF PRESENT ILLNESS: Michele [name] is a 57 y.o. female who presents to the clinic today for:   HPI    Retina Follow Up    Patient presents with  Other.  In both eyes.  This started 1 month ago.  Severity is mild.  Since onset it is gradually worsening.  I, the attending physician,  performed the HPI with the patient and updated documentation appropriately.          Comments    F/U pigmentary retinal dystrophy ou. Patient states floaters have increased in number, not size ,occasional blurred vision reported (pt has sinus infection). Pt feels her vision has gotten a little worse. Pt is prediabetic, A1C 6.5 (  this is up since last A1C per PCP).Pt is controls BS by diet  Denies vit's/gtt's        Last edited by Rennis ChrisZamora, Merwyn Hodapp, MD on 03/17/2017  1:10 PM. (History)    Pt states she feels OU VA has gotten more blurred; Pt reports she is getting over a sinus infection;   Referring physician: Fleet ContrasAvbuere, Edwin, MD [address] [place], [place] [id number]  HISTORICAL INFORMATION:   Selected notes from the MEDICAL RECORD NUMBER Referred by Dr. Heron SabinsH. McFarland for DM exam and concern for pigmentary macular dystrophy;  LEE- 12.17.18 (H. McFarland) [BCVA OD: 20/25 OS: 20/40] Ocular Hx- pigmentary macular dystrophy  PMH- DM, HTN, RA, hep C    CURRENT MEDICATIONS: No current outpatient medications on file. (Ophthalmic Drugs)   No current facility-administered medications for this visit.  (Ophthalmic Drugs)   Current Outpatient Medications (Other)  Medication Sig  . amlodipine-atorvastatin (CADUET) 10-10 MG tablet Take 1 tablet by mouth daily.  . benzonatate (TESSALON) 100 MG capsule Take by mouth 3 (three) times daily as needed for cough.  . gabapentin (NEURONTIN) 400 MG capsule   . hydrochlorothiazide (HYDRODIURIL) 12.5 MG tablet Take 12.5 mg by mouth daily.  Marland Kitchen.  ibuprofen (ADVIL,MOTRIN) 800 MG tablet Take 1 tablet (800 mg total) by mouth every 8 (eight) hours as needed.  . linaclotide (LINZESS) 72 MCG capsule Take 72 mcg by mouth daily before breakfast.  . loratadine (CLARITIN) 10 MG tablet Take 10 mg by mouth daily.  . mometasone-formoterol (DULERA) 100-5 MCG/ACT AERO Inhale 2 puffs into the lungs 2 (two) times daily.  Marland Kitchen. amoxicillin (AMOXIL) 500 MG capsule Take 1 capsule (500 mg total) by mouth 2 (two) times daily. (Patient not taking: Reported on 03/17/2017)  . lisinopril (PRINIVIL,ZESTRIL) 10 MG tablet Take 10 mg by mouth daily.  Marland Kitchen. omeprazole (PRILOSEC) 20 MG capsule    No current facility-administered medications for this visit.  (Other)      REVIEW OF SYSTEMS: ROS    Positive for: Eyes   Negative for: Constitutional, Gastrointestinal, Neurological, Skin, Genitourinary, Musculoskeletal, HENT, Endocrine, Cardiovascular, Respiratory, Psychiatric, Allergic/Imm, Heme/Lymph   Last edited by Eldridge ScotKendrick, Glenda, LPN on 6/1/60733/04/2017  1:05 PM. (History)       ALLERGIES No Known Allergies  PAST MEDICAL HISTORY Past Medical History:  Diagnosis Date  . Arthritis   . Asthma   . Hepatitis    C  . Hypertension    Past Surgical History:  Procedure Laterality Date  . BREAST BIOPSY Left 10/14/2006   x2    FAMILY HISTORY Family History  Problem Relation Age of Onset  . Glaucoma Mother   .  Cataracts Sister   . Macular degeneration Sister   . Diabetes Sister   . Amblyopia Neg Hx   . Blindness Neg Hx   . Retinal detachment Neg Hx   . Stroke Neg Hx   . Retinitis pigmentosa Neg Hx     SOCIAL HISTORY Social History   Tobacco Use  . Smoking status: Former Smoker    Last attempt to quit: 10/16/2016    Years since quitting: 0.4  . Smokeless tobacco: Never Used  Substance Use Topics  . Alcohol use: No  . Drug use: No         OPHTHALMIC EXAM:  Base Eye Exam    Visual Acuity (Snellen - Linear)      Right Left   Dist [place] 20/40 -1 20/60    Dist ph [place] 20/30 +2 20/30 +1       Tonometry (Tonopen, 1:07 PM)      Right Left   Pressure 19 17       Pupils      Dark Light Shape React APD   Right 3 2.5 Round Minimal None   Left 3 2.5 Round Minimal None       Visual Fields (Counting fingers)      Left Right    Full Full       Extraocular Movement      Right Left    Full, Ortho Full, Ortho       Neuro/Psych    Oriented x3:  Yes   Mood/Affect:  Normal       Dilation    Both eyes:  1.0% Mydriacyl, 2.5% Phenylephrine @ 1:07 PM  Phen 10% instilled OU @ 1:20 PM        Slit Lamp and Fundus Exam    Slit Lamp Exam      Right Left   Lids/Lashes Dermatochalasis - upper lid Dermatochalasis - upper lid   Conjunctiva/Sclera Mild Melanosis Nasal Pinguecula   Cornea Inferior 1+ Punctate epithelial erosions 2+ diffuse Punctate epithelial erosions   Anterior Chamber Deep and quiet Deep and quiet   Iris Round and dilated Round and dilated   Lens 2+ Nuclear sclerosis, 2+ Cortical cataract 2+ Nuclear sclerosis, 2+ Cortical cataract   Vitreous Vitreous syneresis Vitreous syneresis       Fundus Exam      Right Left   Disc Normal Normal   C/D Ratio 0.5 0.45   Macula Flat, Good foveal reflex, focal area of pigment clumping just temporal to fovea Good foveal reflex, focal pigment clumping superior and temporal to fovea, Retinal pigment epithelial mottling, Flat, No heme or edema   Vessels Mild Vascular attenuation, Copper wiring, mild AV crossing changes, no vasculitis Mild Vascular attenuation, Copper wiring, mild AV crossing changes, no vasculitis   Periphery Attached Attached          IMAGING AND PROCEDURES  Imaging and Procedures for 03/17/17  OCT, Retina - OU - Both Eyes     Right Eye Quality was good. Central Foveal Thickness: 198. Progression has been stable. Findings include abnormal foveal contour, intraretinal hyper-reflective material, no IRF, no SRF, outer retinal atrophy.   Left Eye Quality was good.  Central Foveal Thickness: 196. Progression has been stable. Findings include abnormal foveal contour, intraretinal hyper-reflective material, outer retinal atrophy, no IRF, no SRF (? Increase ORA inferiorly ).   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 CME - cystoid macular edema. PED - pigment epithelial detachment. IRF - intraretinal fluid. SRF - subretinal fluid. EZ - ellipsoid zone. ERM - epiretinal membrane. ORA - outer retinal atrophy. ORT - outer retinal tubulation. 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 Retinal edema H35.81 OCT, Retina - OU - Both Eyes  3. 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 Pigmentary retinal dystrophy OU  - focal area of RPE clumping just temporal to fovea OU  - FA 01/17/17 shows blockage with some mild staining -- no leakage -- OU  - pt is asymptomatic except for complaint of presbyopia  - BCVA at initial presentation was 20/25 OU; today 20/30+ OU  - differential includes macular CHRPE, torpedo maculopathy, pigmentary pattern dystrophy  - pt has not had an eye exam in 30+ years, so unclear how long pigmentary lesions have been present and whether they have undergone any changes over time  - today no significant change on exam or OCT  - will continue to monitor for now  - Optos color fundus and autofluorescent images obtained 01.04.19 to track progression  - F/U 3 months   2. No retinal edema on exam or OCT  3. Borderline DM without retinopathy - The incidence, risk factors for progression, natural history and treatment options for diabetic retinopathy  were discussed with patient.   - The need for close monitoring of blood glucose, blood pressure, and serum  lipids, avoiding cigarette or any type of tobacco, and the need for long term follow up was also discussed with patient. - f/u in 1 year, sooner prn   4. Combined form of age related cataract OU- - The symptoms of cataract, surgical options, and treatments and risks were discussed with patient. - discussed diagnosis and progression - approaching visual significance -- pt reports difficulty with glare and driving at night - will refer to Dr. Baker Pierini for cataract evaluation - pigmentary retinal dystrophy appears stable and pt cleared to proceed with cataract surgery as needed   Ophthalmic Meds Ordered this visit:  No orders of the defined types were placed in this encounter.      No Follow-up on file.  There are no Patient Instructions on file for this visit.   Explained the diagnoses, plan, and follow up with the patient and they expressed understanding.  Patient expressed understanding of the importance of proper follow up care.   This document serves as a record of services personally performed by Karie Chimera, MD, PhD. It was created on their behalf by Virgilio Belling, COA, a certified ophthalmic assistant. The creation of this record is the provider's dictation and/or activities during the visit.  Electronically signed by: Virgilio Belling, COA  03/17/17 1:29 PM    Karie Chimera, M.D., Ph.D. Diseases & Surgery of the Retina and Vitreous [facility] 03/17/17   I have reviewed the above documentation for accuracy and completeness, and I agree with the above. Karie Chimera, M.D., Ph.D. 03/17/17 1:29 PM     Abbreviations: M myopia (nearsighted); A astigmatism; H hyperopia (farsighted); P presbyopia; Mrx spectacle prescription;  CTL contact lenses; OD right eye; OS left eye; OU both eyes  XT exotropia; ET esotropia; PEK punctate epithelial keratitis;  PEE punctate epithelial erosions; DES dry eye syndrome; MGD meibomian gland dysfunction; ATs  artificial tears; PFAT's preservative free artificial tears; NSC nuclear sclerotic cataract; PSC posterior subcapsular cataract; ERM epi-retinal membrane; PVD posterior vitreous detachment; RD retinal detachment; DM diabetes mellitus; DR diabetic retinopathy; NPDR non-proliferative diabetic retinopathy; PDR proliferative diabetic retinopathy; CSME clinically significant macular edema; DME diabetic macular edema; dbh dot blot hemorrhages; CWS cotton wool spot; POAG primary open angle glaucoma; C/D cup-to-disc ratio; HVF humphrey visual field; GVF goldmann visual field; OCT optical coherence tomography; IOP intraocular pressure; BRVO Branch retinal vein occlusion; CRVO central retinal vein occlusion; CRAO central retinal artery occlusion; BRAO branch retinal artery occlusion; RT retinal tear; SB scleral buckle; PPV pars plana vitrectomy; VH Vitreous hemorrhage; PRP panretinal laser photocoagulation; IVK intravitreal kenalog; VMT vitreomacular traction; MH Macular hole;  NVD neovascularization of the disc; NVE neovascularization elsewhere; AREDS age related eye disease study; ARMD age related macular degeneration; POAG primary open angle glaucoma; EBMD epithelial/anterior basement membrane dystrophy; ACIOL anterior chamber intraocular lens; IOL intraocular lens; PCIOL posterior chamber intraocular lens; Phaco/IOL phacoemulsification with intraocular lens placement; PRK photorefractive keratectomy; LASIK laser assisted in situ keratomileusis; HTN hypertension; DM diabetes mellitus; COPD chronic obstructive pulmonary disease

## 2017-03-17 ENCOUNTER — Ambulatory Visit (INDEPENDENT_AMBULATORY_CARE_PROVIDER_SITE_OTHER): Payer: Medicare Other | Admitting: Ophthalmology

## 2017-03-17 ENCOUNTER — Encounter (INDEPENDENT_AMBULATORY_CARE_PROVIDER_SITE_OTHER): Payer: Self-pay | Admitting: Ophthalmology

## 2017-03-17 DIAGNOSIS — R7303 Prediabetes: Secondary | ICD-10-CM | POA: Diagnosis not present

## 2017-03-17 DIAGNOSIS — H25813 Combined forms of age-related cataract, bilateral: Secondary | ICD-10-CM

## 2017-03-17 DIAGNOSIS — H3581 Retinal edema: Secondary | ICD-10-CM | POA: Diagnosis not present

## 2017-03-17 DIAGNOSIS — H3552 Pigmentary retinal dystrophy: Secondary | ICD-10-CM

## 2017-03-18 ENCOUNTER — Telehealth (INDEPENDENT_AMBULATORY_CARE_PROVIDER_SITE_OTHER): Payer: Self-pay

## 2017-05-12 ENCOUNTER — Telehealth (INDEPENDENT_AMBULATORY_CARE_PROVIDER_SITE_OTHER): Payer: Self-pay

## 2017-05-12 NOTE — Telephone Encounter (Signed)
Pt called requesting to be referred to another cataract surgeon; Pt states she does not want to have surgery at surgical center due to financial responsibility; Pt requests to have cataract sx done at [facility]; Spoke to Dr. Vanessa Barbara, [facility] does not have any surgical capability to perform any ocular surgeries, also cataract sx in not normally performed in a hospital setting; Called pt back to relay this information; Pt did not answer and a message was left for pt to call back if she had any further questions;   Virgilio Belling, COA

## 2017-06-17 ENCOUNTER — Encounter (INDEPENDENT_AMBULATORY_CARE_PROVIDER_SITE_OTHER): Payer: Self-pay | Admitting: Ophthalmology

## 2017-06-24 ENCOUNTER — Ambulatory Visit (INDEPENDENT_AMBULATORY_CARE_PROVIDER_SITE_OTHER): Payer: Medicare Other | Admitting: Orthopaedic Surgery

## 2017-06-24 ENCOUNTER — Ambulatory Visit (INDEPENDENT_AMBULATORY_CARE_PROVIDER_SITE_OTHER): Payer: Medicare Other

## 2017-06-24 ENCOUNTER — Encounter (INDEPENDENT_AMBULATORY_CARE_PROVIDER_SITE_OTHER): Payer: Self-pay | Admitting: Orthopaedic Surgery

## 2017-06-24 VITALS — BP 122/74 | HR 80 | Ht 65.0 in | Wt 221.0 lb

## 2017-06-24 DIAGNOSIS — M25562 Pain in left knee: Secondary | ICD-10-CM | POA: Diagnosis not present

## 2017-06-24 DIAGNOSIS — M545 Low back pain: Secondary | ICD-10-CM

## 2017-06-24 DIAGNOSIS — M25561 Pain in right knee: Secondary | ICD-10-CM

## 2017-06-24 DIAGNOSIS — G8929 Other chronic pain: Secondary | ICD-10-CM | POA: Diagnosis not present

## 2017-06-24 DIAGNOSIS — Z96653 Presence of artificial knee joint, bilateral: Secondary | ICD-10-CM | POA: Diagnosis not present

## 2017-06-24 DIAGNOSIS — Z96659 Presence of unspecified artificial knee joint: Secondary | ICD-10-CM | POA: Insufficient documentation

## 2017-06-24 NOTE — Progress Notes (Signed)
Office Visit Note   Patient: Michele [name]           Date of Birth: July 29, 1960           MRN: [id number] Visit Date: 06/24/2017              Requested by: Fleet ContrasAvbuere, Edwin, MD [address] [place], [place] [id number] PCP: Fleet ContrasAvbuere, Edwin, MD   Assessment & Plan: Visit Diagnoses:  1. Chronic bilateral low back pain, with sciatica presence unspecified   2. Chronic pain of both knees   3. History of total bilateral knee replacement     Plan: We will refer patient for physical therapy.  She asked about pain medication I discussed with her I do not think that it is a good idea particularly with her history of drug abuse, detox, cocaine, alcohol, THC etc.  She needs to work on weight loss lose at least 40 pounds.  We will set up for some physical therapy for evaluation treatment of her back pain.  Total knee arthroplasties look good on x-ray without evidence of loosening or subsidence.  After therapy she can can transition to the gym to work on weight loss and improve her overall fitness which should help her knees as well as her back.  Patient mentioned that she got 10 tablets of hydrocodone 5/325 by her PCP yesterday and this is not helped her pain.  Follow-Up Instructions: Return in about 3 months (around 09/24/2017).   Orders:  Orders Placed This Encounter  Procedures  . XR Lumbar Spine 2-3 Views  . XR Pelvis 1-2 Views  . XR Knee 1-2 Views Right  . XR Knee 1-2 Views Left  . Ambulatory referral to Physical Therapy   No orders of the defined types were placed in this encounter.     Procedures: No procedures performed   Clinical Data: No additional findings.   Subjective: Chief Complaint  Patient presents with  . Lower Back - Pain  . Right Knee - Pain  . Left Knee - Pain    HPI 57 year old female returns and has not been seen since 2012.  She states she is having knee pain, back pain pain in her hips also pain in her shoulders.  Chart review shows she has been getting  20 tablets of hydrocodone just about monthly from her PCP.  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  She had L5-S1 surgery many years ago.  She denies associated bowel or bladder symptoms.  Review of Systems review of system positive for prediabetes pigmented retinal dystrophy, history of substance abuse with detox, alcohol detox.  History of dental infections, obesity, previous bilateral total knee arthroplasties.   Objective: Vital Signs: BP 122/74   Pulse 80   Ht 5\' 5"  (1.651 m)   Wt 221 lb (100.2 kg)   BMI 36.78 kg/m   Physical Exam  Constitutional: She is oriented to person, place, and time. She appears well-developed.  HENT:  Head: Normocephalic.  Right Ear: External ear normal.  Left Ear: External ear normal.  Eyes: Pupils are equal, round, and reactive to light.  Neck: No tracheal deviation present. No thyromegaly present.  Cardiovascular: Normal rate.  Pulmonary/Chest: Effort normal.  Abdominal: Soft.  Neurological: She is alert and oriented to person, place, and time.  Skin: Skin is warm and dry.  Psychiatric: She has a normal mood and affect. Her behavior is normal.    Ortho Exam spine is straight pelvis is level  patient has good hip range of motion.  She complains of pain getting from sitting standing.  Increased truncal BMI.  Reflexes are 2+ well-healed total knee arthroplasty incisions no effusion collateral ligamentous balancing and drawer testing are symmetrical.  Good hip range of motion. Specialty Comments:  No specialty comments available.  Imaging: Xr Knee 1-2 Views Left  Result Date: 06/24/2017 Standing AP both knees lateral left knee obtained and reviewed.  This shows previous total knee arthroplasty.  Trace lucent line underneath the tibial tray medially.  Heel is well fixed.  Good position alignment. Impression: Satisfactory total knee arthroplasties.  Previous medial tibial plateau changes from previous fracture and right tib-fib  proximal third fracture healed.  Xr Knee 1-2 Views Right  Result Date: 06/24/2017 Standing AP both knees lateral right knee obtained and reviewed.  This shows old proximal third tib-fib fracture healed.  Right total knee arthroplasty without evidence of loosening.  No acute changes are noted. Impression: Old proximal third tib-fib fracture which are healed.  Total knee arthroplasty right knee satisfactory alignment and position.  Xr Lumbar Spine 2-3 Views  Result Date: 06/24/2017 AP lateral lumbar x-rays obtained and reviewed.  This shows previous laminotomy L5-S1 on the right.  Grade 1 anterolisthesis L4-5 with some facet arthropathy.  Narrowing at L5-S1.  No acute changes. Impression: Lumbar disc degeneration with mild narrowing anterolisthesis grade 1 L4-5 and postop changes L5-S1.  Xr Pelvis 1-2 Views  Result Date: 06/24/2017 AP pelvis x-rays obtained and reviewed.  This shows normal hip joints without acute changes in the femoral neck.  No lytic or sclerotic pelvic lesions. Impression: Normal pelvis x-ray.    PMFS History: Patient Active Problem List   Diagnosis Date Noted  . History of total knee arthroplasty 06/24/2017   Past Medical History:  Diagnosis Date  . Arthritis   . Asthma   . Hepatitis    C  . Hypertension     Family History  Problem Relation Age of Onset  . Glaucoma Mother   . Cataracts Sister   . Macular degeneration Sister   . Diabetes Sister   . Amblyopia Neg Hx   . Blindness Neg Hx   . Retinal detachment Neg Hx   . Stroke Neg Hx   . Retinitis pigmentosa Neg Hx     Past Surgical History:  Procedure Laterality Date  . BREAST BIOPSY Left 10/14/2006   x2   Social History   Occupational History  . Not on file  Tobacco Use  . Smoking status: Former Smoker    Last attempt to quit: 10/16/2016    Years since quitting: 0.6  . Smokeless tobacco: Never Used  Substance and Sexual Activity  . Alcohol use: No  . Drug use: No  . Sexual activity: Not on  file

## 2017-07-03 ENCOUNTER — Other Ambulatory Visit: Payer: Self-pay

## 2017-07-03 ENCOUNTER — Ambulatory Visit: Payer: Medicare Other | Attending: Orthopaedic Surgery | Admitting: Physical Therapy

## 2017-07-03 ENCOUNTER — Encounter: Payer: Self-pay | Admitting: Physical Therapy

## 2017-07-03 DIAGNOSIS — G8929 Other chronic pain: Secondary | ICD-10-CM | POA: Diagnosis present

## 2017-07-03 DIAGNOSIS — R252 Cramp and spasm: Secondary | ICD-10-CM | POA: Insufficient documentation

## 2017-07-03 DIAGNOSIS — M5440 Lumbago with sciatica, unspecified side: Secondary | ICD-10-CM | POA: Insufficient documentation

## 2017-07-03 DIAGNOSIS — R262 Difficulty in walking, not elsewhere classified: Secondary | ICD-10-CM | POA: Insufficient documentation

## 2017-07-03 DIAGNOSIS — M6281 Muscle weakness (generalized): Secondary | ICD-10-CM | POA: Insufficient documentation

## 2017-07-03 DIAGNOSIS — R293 Abnormal posture: Secondary | ICD-10-CM | POA: Insufficient documentation
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
## 2017-07-03 NOTE — Patient Instructions (Signed)
Posture Tips DO: - stand tall and erect - keep chin tucked in - keep head and shoulders in alignment - check posture regularly in mirror or large window - pull head back against headrest in car seat;  Change your position often.  Sit with lumbar support. DON'T: - slouch or slump while watching TV or reading - sit, stand or lie in one position  for too long;  Sitting is especially hard on the spine so if you sit at a desk/use the computer, then stand up often!   Copyright  VHI. All rights reserved.  Posture - Standing   Good posture is important. Avoid slouching and forward head thrust. Maintain curve in low back and align ears over shoul- ders, hips over ankles.  Pull your belly button in toward your back bone. Even weight in your heels, ribs lifted up and chin down.     Copyright  VHI. All rights reserved.  Posture - Sitting   Sit upright, head facing forward. Try using a roll to support lower back. Keep shoulders relaxed, and avoid rounded back. Keep hips level with knees. Avoid crossing legs for long periods.  Sit on sit bones not tail bone   Copyright  VHI. All rights reserved.    Elbow Prop (Extension)   Prop body up on elbows for __10__ seconds. Slowly lower it. Repeat __5__ times. Do _3___ sessions per day.       Backward Bend (Standing)   Arch backward to make hollow of back deeper. Hold _10___ seconds. Repeat __5__ times per set.  Try to do every hour or two while up in the day  Copyright  VHI. All rights reserved.    Garen LahLawrie Beardsley, PT Certified Exercise Expert for the Aging Adult  07/03/17 2:53 PM Phone: [phone] Fax: [phone]

## 2017-07-15 ENCOUNTER — Encounter: Payer: Self-pay | Admitting: Physical Therapy

## 2017-07-15 ENCOUNTER — Ambulatory Visit: Payer: Medicare Other | Attending: Orthopaedic Surgery | Admitting: Physical Therapy

## 2017-07-15 DIAGNOSIS — R252 Cramp and spasm: Secondary | ICD-10-CM | POA: Diagnosis present

## 2017-07-15 DIAGNOSIS — R262 Difficulty in walking, not elsewhere classified: Secondary | ICD-10-CM | POA: Diagnosis present

## 2017-07-15 DIAGNOSIS — M5440 Lumbago with sciatica, unspecified side: Secondary | ICD-10-CM | POA: Insufficient documentation

## 2017-07-15 DIAGNOSIS — M6281 Muscle weakness (generalized): Secondary | ICD-10-CM | POA: Insufficient documentation

## 2017-07-15 DIAGNOSIS — R293 Abnormal posture: Secondary | ICD-10-CM | POA: Diagnosis present

## 2017-07-15 DIAGNOSIS — G8929 Other chronic pain: Secondary | ICD-10-CM | POA: Diagnosis present

## 2017-07-15 NOTE — Therapy (Signed)
[facility] [address] [place], [place], [address] Phone: [phone]   Fax:  [phone]  Physical Therapy Treatment  Patient Details  Name: Michele [name] MRN: [id number] Date of Birth: 01/16/1960 Referring Provider: Annell Greening MD   Encounter Date: 07/15/2017  PT End of Session - 07/15/17 1152    Visit Number  2    Number of Visits  16    Date for PT Re-Evaluation  08/28/17    Authorization Type  [employer] Medicare   Progress note on the 10th visit    PT Start Time  1145    PT Stop Time  1226    PT Time Calculation (min)  41 min       Past Medical History:  Diagnosis Date  . Arthritis   . Asthma   . Hepatitis    C  . Hypertension     Past Surgical History:  Procedure Laterality Date  . BREAST BIOPSY Left 10/14/2006   x2    There were no vitals filed for this visit.  Subjective Assessment - 07/15/17 1150    Currently in Pain?  Yes    Pain Score  8     Pain Location  Back    Pain Orientation  Left;Right    Pain Descriptors / Indicators  Aching    Pain Radiating Towards  LLE     Aggravating Factors   bending, carrying, sleeping, standing     Pain Relieving Factors  sitting in recliner, bio freeze                        [facility] Adult PT Treatment/Exercise - 07/15/17 0001      Lumbar Exercises: Stretches   Active Hamstring Stretch  2 reps;30 seconds    Single Knee to Chest Stretch  2 reps;30 seconds    Lower Trunk Rotation  10 seconds 10 reps    Prone on Elbows Stretch  --      Lumbar Exercises: Standing   Other Standing Lumbar Exercises  lateral shift to right into wall 5 x 10 sec hold     Other Standing Lumbar Exercises  Georgianne Fick extension 5 x 10 sec against counter  centralizes leg pain      Lumbar Exercises: Supine   Ab Set  5 reps    Pelvic Tilt  10 reps    Bridge  5 reps pain    Straight Leg Raise  10 reps    Straight Leg Raises Limitations  with abdominal draw in      Lumbar  Exercises: Prone   Other Prone Lumbar Exercises  McKenzie Prone on elbows x 60 sec       Knee/Hip Exercises: Seated   Long Arc Quad  20 reps      Knee/Hip Exercises: Supine   Quad Sets  Right;Left;10 reps               PT Short Term Goals - 07/03/17 1455      PT SHORT TERM GOAL #1   Title  Pt will be independent with initial HEP     Time  3    Period  Weeks    Status  New    Target Date  07/24/17      PT SHORT TERM GOAL #2   Title  "Demonstrate understanding of proper sitting posture and be more conscious of position and posture throughout the day.     Time  3    Period  Weeks    Status  New    Target Date  07/24/17      PT SHORT TERM GOAL #3   Title  "Report pain decrease from 8/10 to  5 /10 in order to stand and prepare food for at least 15 minutes    Time  3    Period  Weeks    Status  New    Target Date  07/24/17      PT SHORT TERM GOAL #4   Title  Pt will be able to negotiate steps with 50 % greater ease    Time  3    Period  Weeks    Status  New    Target Date  07/24/17        PT Long Term Goals - 07/03/17 1500      PT LONG TERM GOAL #1   Title  "Pt will be independent with advanced HEP.     Time  8    Period  Weeks    Status  New    Target Date  08/28/17      PT LONG TERM GOAL #2   Title  "FOTO will improve from 75%limitaiton   to 58 % limitation    indicating improved functional mobility    Time  8    Period  Weeks    Status  New    Target Date  08/28/17      PT LONG TERM GOAL #3   Title  "Demonstrate and verbalize techniques to reduce the risk of re-injury including: lifting, posture, body mechanics.     Time  8    Period  Weeks    Status  New    Target Date  08/28/17      PT LONG TERM GOAL #4   Title  Pt will be able to negotiate steps without exacerbating  pain greater thatn 3/10 pain in back and knees    Time  8    Period  Weeks    Status  New    Target Date  08/28/17      PT LONG TERM GOAL #5   Title  Pt will be able to  sleep 4 hours of uninterrupted sleep at night and be able to roll onto left side without waking for more restful sleep    Time  8    Period  Weeks    Status  New    Target Date  08/28/17      PT LONG TERM GOAL #6   Title  Pt will be able to pick up 15 lb of groceries from floor without exacerbating back pain    Time  8    Period  Weeks    Status  New    Target Date  08/28/17      PT LONG TERM GOAL #7   Title  Pt will be able to perform household chores such as mowing lawn with pain no greater thtan 3/10 pain    Time  8    Period  Weeks    Status  New    Target Date  08/28/17            Plan - 07/15/17 1256    Clinical Impression Statement  Pt arrives reporting no change and she lost her instructions from last visit. We reviewed her initial HEP and Lumbar extension did decrease her radicular pain in LLE. Continued with basic knee and basic back  HEP instruction per PT POC. No increased pain at end of session. Soreness reported is left lateral hip when pressure placed on that area. She would benefit from soft tissue work.     PT Next Visit Plan  knee HEP with basic Quad set, SLR, LAQ etc, assess benefit of Georgianne FickMc Kenzie exercise for centralizing pain.  Given HEP for basic back; try manual to left low back/lateral hip -add hip stretches    PT Home Exercise Plan  Mckenzie prone on elbow, standing ext and lateral shift to right for left side pain    Consulted and Agree with Plan of Care  Patient       Patient will benefit from skilled therapeutic intervention in order to improve the following deficits and impairments:  Abnormal gait, Decreased mobility, Decreased range of motion, Difficulty walking, Decreased strength, Hypomobility, Increased muscle spasms, Obesity, Postural dysfunction, Improper body mechanics, Pain  Visit Diagnosis: Chronic left-sided low back pain with sciatica, sciatica laterality unspecified  Muscle weakness (generalized)  Difficulty in walking, not elsewhere  classified  Abnormal posture  Cramp and spasm     Problem List Patient Active Problem List   Diagnosis Date Noted  . History of total knee arthroplasty 06/24/2017    Sherrie Mustacheonoho, Jay Kempe McGee , PTA 07/15/2017, 1:04 PM  [facility] [address] [place], [place], [address] Phone: [phone]   Fax:  [phone]  Name: Michele [name] MRN: [id number] Date of Birth: May 09, 1960

## 2017-07-15 NOTE — Patient Instructions (Addendum)
Pelvic Tilt    Flatten back by tightening stomach muscles and buttocks. Repeat ___10_ times per set. Do _2___ sets per session. Do _2___ sessions per day.   Straight Leg Raise    Squeeze abdominals and hold. Tighten top of left thigh. Raise leg off bed _12__ inches. Hold for _5__ seconds.Return. Repeat _10__ times each leg . Do ___ times a day. Repeat with other leg.   Long [employer] Quad    Straighten operated leg and try to hold it ____ seconds.  Repeat _20___ times each. Do ___2_ sessions a day.  Lower Trunk Rotation Stretch    Keeping back flat and feet together, rotate knees to left side. Hold __10__ seconds. Repeat ___10_ times per set. Do __1__ sets per session. Do ___2_ sessions per day.  http://orth.exer.us/123   Copyright  VHI. All rights reserved.

## 2017-07-22 ENCOUNTER — Encounter: Payer: Self-pay | Admitting: Physical Therapy

## 2017-07-22 ENCOUNTER — Ambulatory Visit: Payer: Medicare Other | Admitting: Physical Therapy

## 2017-07-22 DIAGNOSIS — M5440 Lumbago with sciatica, unspecified side: Principal | ICD-10-CM

## 2017-07-22 DIAGNOSIS — R252 Cramp and spasm: Secondary | ICD-10-CM

## 2017-07-22 DIAGNOSIS — M6281 Muscle weakness (generalized): Secondary | ICD-10-CM

## 2017-07-22 DIAGNOSIS — G8929 Other chronic pain: Secondary | ICD-10-CM

## 2017-07-22 DIAGNOSIS — R262 Difficulty in walking, not elsewhere classified: Secondary | ICD-10-CM

## 2017-07-22 DIAGNOSIS — R293 Abnormal posture: Secondary | ICD-10-CM

## 2017-07-22 NOTE — Therapy (Signed)
[facility] Rehabilitation [facility] [address] [place], [place], [address] Phone: [phone]   Fax:  [phone]  Physical Therapy Treatment  Patient Details  Name: Michele [name] MRN: [id number] Date of Birth: 08/04/1960 Referring Provider: Annell Greening MD   Encounter Date: 07/22/2017  PT End of Session - 07/22/17 1020    Visit Number  3    Number of Visits  16    Date for PT Re-Evaluation  08/28/17    Authorization Type  [employer] Medicare   Progress note on the 10th visit    PT Start Time  1015    PT Stop Time  1100    PT Time Calculation (min)  45 min       Past Medical History:  Diagnosis Date  . Arthritis   . Asthma   . Hepatitis    C  . Hypertension     Past Surgical History:  Procedure Laterality Date  . BREAST BIOPSY Left 10/14/2006   x2    There were no vitals filed for this visit.  Subjective Assessment - 07/22/17 1018    Subjective  Thighs are sore. Back pain 8/10. Pain no longer going down leg. Still sore and unable to lay on left hip.     Currently in Pain?  Yes    Pain Score  8     Pain Location  Back    Pain Orientation  Left;Right    Pain Descriptors / Indicators  Aching    Aggravating Factors   standing    Pain Relieving Factors  bio freeze, recliner                        [facility] Adult PT Treatment/Exercise - 07/22/17 0001      Self-Care   Self-Care  Other Self-Care Comments    Other Self-Care Comments   Instructed pt in self tennis ball piriformis release       Lumbar Exercises: Stretches   Active Hamstring Stretch  --    Single Knee to Chest Stretch  --    Lower Trunk Rotation  10 seconds 10 reps    Hip Flexor Stretch  1 rep;60 seconds    Piriformis Stretch  3 reps;30 seconds    Figure 4 Stretch  3 reps;30 seconds modified       Lumbar Exercises: Supine   Pelvic Tilt  10 reps    Heel Slides  10 reps maintaining abdominal contraction    Bridge  10 reps    Straight Leg Raise  10 reps    Straight Leg Raises Limitations  with abdominal draw in      Lumbar Exercises: Prone   Other Prone Lumbar Exercises  McKenzie Prone on elbows x 60 sec , press up x 3    Other Prone Lumbar Exercises  Prone Tra contract with h/s curls x 10 each, then alternating hip extensions       Manual Therapy   Manual Therapy  Soft tissue mobilization    Soft tissue mobilization  Use of massage roller and tennis ball for soft tissue work to bilateral gluteals and piriformis with passive prone hip IR/ER                PT Short Term Goals - 07/03/17 1455      PT SHORT TERM GOAL #1   Title  Pt will be independent with initial HEP     Time  3    Period  Weeks    Status  New    Target Date  07/24/17      PT SHORT TERM GOAL #2   Title  "Demonstrate understanding of proper sitting posture and be more conscious of position and posture throughout the day.     Time  3    Period  Weeks    Status  New    Target Date  07/24/17      PT SHORT TERM GOAL #3   Title  "Report pain decrease from 8/10 to  5 /10 in order to stand and prepare food for at least 15 minutes    Time  3    Period  Weeks    Status  New    Target Date  07/24/17      PT SHORT TERM GOAL #4   Title  Pt will be able to negotiate steps with 50 % greater ease    Time  3    Period  Weeks    Status  New    Target Date  07/24/17        PT Long Term Goals - 07/03/17 1500      PT LONG TERM GOAL #1   Title  "Pt will be independent with advanced HEP.     Time  8    Period  Weeks    Status  New    Target Date  08/28/17      PT LONG TERM GOAL #2   Title  "FOTO will improve from 75%limitaiton   to 58 % limitation    indicating improved functional mobility    Time  8    Period  Weeks    Status  New    Target Date  08/28/17      PT LONG TERM GOAL #3   Title  "Demonstrate and verbalize techniques to reduce the risk of re-injury including: lifting, posture, body mechanics.     Time  8    Period  Weeks    Status  New     Target Date  08/28/17      PT LONG TERM GOAL #4   Title  Pt will be able to negotiate steps without exacerbating  pain greater thatn 3/10 pain in back and knees    Time  8    Period  Weeks    Status  New    Target Date  08/28/17      PT LONG TERM GOAL #5   Title  Pt will be able to sleep 4 hours of uninterrupted sleep at night and be able to roll onto left side without waking for more restful sleep    Time  8    Period  Weeks    Status  New    Target Date  08/28/17      PT LONG TERM GOAL #6   Title  Pt will be able to pick up 15 lb of groceries from floor without exacerbating back pain    Time  8    Period  Weeks    Status  New    Target Date  08/28/17      PT LONG TERM GOAL #7   Title  Pt will be able to perform household chores such as mowing lawn with pain no greater thtan 3/10 pain    Time  8    Period  Weeks    Status  New    Target Date  08/28/17  Plan - 07/22/17 1024    Clinical Impression Statement  Decreased radicular symptoms however continued LBP and hip tenderness. Standing limited to 5 minutes and unable to lie on left side. Instructed pt in hip stretches and performed STW to bilateral hip/ gluteals. Reviewed HEP. and updated to include hip stretches and tennis ball for piriformis release. No increased pain after treatment.     PT Next Visit Plan  knee HEP with basic Quad set, SLR, LAQ etc, assess benefit of Georgianne Fick exercise for centralizing pain.  Given HEP for basic back; try manual to left low back/lateral hip -add hip stretches    PT Home Exercise Plan  Mckenzie prone on elbow, standing ext and lateral shift to right for left side pain, LAQ, SLR, pelvic tilit, LTR, knee to chest, piriformis stretch, figure 4 stretch , hip flexor stretch, tennis ball to piriformis    Consulted and Agree with Plan of Care  Patient       Patient will benefit from skilled therapeutic intervention in order to improve the following deficits and impairments:   Abnormal gait, Decreased mobility, Decreased range of motion, Difficulty walking, Decreased strength, Hypomobility, Increased muscle spasms, Obesity, Postural dysfunction, Improper body mechanics, Pain  Visit Diagnosis: Chronic left-sided low back pain with sciatica, sciatica laterality unspecified  Muscle weakness (generalized)  Difficulty in walking, not elsewhere classified  Abnormal posture  Cramp and spasm     Problem List Patient Active Problem List   Diagnosis Date Noted  . History of total knee arthroplasty 06/24/2017    Jannette Spanner [facility] 07/22/2017, 12:48 PM  [facility] [address] [place], [place], [address] Phone: [phone]   Fax:  [phone]  Name: Michele [name] MRN: [id number] Date of Birth: 26-Aug-1960

## 2017-07-25 ENCOUNTER — Ambulatory Visit: Payer: Medicare Other | Admitting: Physical Therapy

## 2017-07-25 ENCOUNTER — Encounter: Payer: Self-pay | Admitting: Physical Therapy

## 2017-07-25 DIAGNOSIS — R252 Cramp and spasm: Secondary | ICD-10-CM

## 2017-07-25 DIAGNOSIS — R293 Abnormal posture: Secondary | ICD-10-CM

## 2017-07-25 DIAGNOSIS — R262 Difficulty in walking, not elsewhere classified: Secondary | ICD-10-CM

## 2017-07-25 DIAGNOSIS — M6281 Muscle weakness (generalized): Secondary | ICD-10-CM

## 2017-07-25 DIAGNOSIS — G8929 Other chronic pain: Secondary | ICD-10-CM

## 2017-07-25 DIAGNOSIS — M5440 Lumbago with sciatica, unspecified side: Principal | ICD-10-CM
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
## 2017-07-29 ENCOUNTER — Ambulatory Visit: Payer: Medicare Other | Admitting: Physical Therapy

## 2017-07-29 ENCOUNTER — Encounter: Payer: Self-pay | Admitting: Physical Therapy

## 2017-07-29 DIAGNOSIS — R252 Cramp and spasm: Secondary | ICD-10-CM

## 2017-07-29 DIAGNOSIS — R262 Difficulty in walking, not elsewhere classified: Secondary | ICD-10-CM

## 2017-07-29 DIAGNOSIS — M5440 Lumbago with sciatica, unspecified side: Secondary | ICD-10-CM | POA: Diagnosis not present

## 2017-07-29 DIAGNOSIS — R293 Abnormal posture: Secondary | ICD-10-CM

## 2017-07-29 DIAGNOSIS — G8929 Other chronic pain: Secondary | ICD-10-CM

## 2017-07-29 DIAGNOSIS — M6281 Muscle weakness (generalized): Secondary | ICD-10-CM
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
## 2017-07-29 NOTE — Patient Instructions (Signed)
Sleeping on Back  Place pillow under knees. A pillow with cervical support and a roll around waist are also helpful. Copyright  VHI. All rights reserved.  Sleeping on Side Place pillow between knees. Use cervical support under neck and a roll around waist as needed. Copyright  VHI. All rights reserved.   Sleeping on Stomach   If this is the only desirable sleeping position, place pillow under lower legs, and under stomach or chest as needed.  Posture - Sitting   Sit upright, head facing forward. Try using a roll to support lower back. Keep shoulders relaxed, and avoid rounded back. Keep hips level with knees. Avoid crossing legs for long periods. Stand to Sit / Sit to Stand   To sit: Bend knees to lower self onto front edge of chair, then scoot back on seat. To stand: Reverse sequence by placing one foot forward, and scoot to front of seat. Use rocking motion to stand up.   Work Height and Reach  Ideal work height is no more than 2 to 4 inches below elbow level when standing, and at elbow level when sitting. Reaching should be limited to arm's length, with elbows slightly bent.  Bending  Bend at hips and knees, not back. Keep feet shoulder-width apart.    Posture - Standing   Good posture is important. Avoid slouching and forward head thrust. Maintain curve in low back and align ears over shoul- ders, hips over ankles.  Alternating Positions   Alternate tasks and change positions frequently to reduce fatigue and muscle tension. Take rest breaks. Computer Work   Position work to face forward. Use proper work and seat height. Keep shoulders back and down, wrists straight, and elbows at right angles. Use chair that provides full back support. Add footrest and lumbar roll as needed.  Getting Into / Out of Car  Lower self onto seat, scoot back, then bring in one leg at a time. Reverse sequence to get out.  Dressing  Lie on back to pull socks or slacks over feet, or sit  and bend leg while keeping back straight.    Housework - Sink  Place one foot on ledge of cabinet under sink when standing at sink for prolonged periods.   Pushing / Pulling  Pushing is preferable to pulling. Keep back in proper alignment, and use leg muscles to do the work.  Deep Squat   Squat and lift with both arms held against upper trunk. Tighten stomach muscles without holding breath. Use smooth movements to avoid jerking.  Avoid Twisting   Avoid twisting or bending back. Pivot around using foot movements, and bend at knees if needed when reaching for articles.  Carrying Luggage   Distribute weight evenly on both sides. Use a cart whenever possible. Do not twist trunk. Move body as a unit.   Lifting Principles .Maintain proper posture and head alignment. .Slide object as close as possible before lifting. .Move obstacles out of the way. .Test before lifting; ask for help if too heavy. .Tighten stomach muscles without holding breath. .Use smooth movements; do not jerk. .Use legs to do the work, and pivot with feet. .Distribute the work load symmetrically and close to the center of trunk. .Push instead of pull whenever possible.   Ask For Help   Ask for help and delegate to others when possible. Coordinate your movements when lifting together, and maintain the low back curve.  Log Roll   Lying on back, bend left knee and place left   arm across chest. Roll all in one movement to the right. Reverse to roll to the left. Always move as one unit. Housework - Sweeping  Use long-handled equipment to avoid stooping.   Housework - Wiping  Position yourself as close as possible to reach work surface. Avoid straining your back.  Laundry - Unloading Wash   To unload small items at bottom of washer, lift leg opposite to arm being used to reach.  Reynolds close to area to be raked. Use arm movements to do the work. Keep back straight and avoid  twisting.     Cart  When reaching into cart with one arm, lift opposite leg to keep back straight.   Getting Into / Out of Bed  Lower self to lie down on one side by raising legs and lowering head at the same time. Use arms to assist moving without twisting. Bend both knees to roll onto back if desired. To sit up, start from lying on side, and use same move-ments in reverse. Housework - Vacuuming  Hold the vacuum with arm held at side. Step back and forth to move it, keeping head up. Avoid twisting.   Laundry - IT consultant so that bending and twisting can be avoided.   Laundry - Unloading Dryer  Squat down to reach into clothes dryer or use a reacher.  Gardening - Weeding / Probation officer or Kneel. Knee pads may be helpful.                    Trigger Point Dry Needling  . What is Trigger Point Dry Needling (DN)? o DN is a physical therapy technique used to treat muscle pain and dysfunction. Specifically, DN helps deactivate muscle trigger points (muscle knots).  o A thin filiform needle is used to penetrate the skin and stimulate the underlying trigger point. The goal is for a local twitch response (LTR) to occur and for the trigger point to relax. No medication of any kind is injected during the procedure.   . What Does Trigger Point Dry Needling Feel Like?  o The procedure feels different for each individual patient. Some patients report that they do not actually feel the needle enter the skin and overall the process is not painful. Very mild bleeding may occur. However, many patients feel a deep cramping in the muscle in which the needle was inserted. This is the local twitch response.   Marland Kitchen How Will I feel after the treatment? o Soreness is normal, and the onset of soreness may not occur for a few hours. Typically this soreness does not last longer than two days.  o Bruising is uncommon, however; ice can be used to decrease any  possible bruising.  o In rare cases feeling tired or nauseous after the treatment is normal. In addition, your symptoms may get worse before they get better, this period will typically not last longer than 24 hours.   . What Can I do After My Treatment? o Increase your hydration by drinking more water for the next 24 hours. o You may place ice or heat on the areas treated that have become sore, however, do not use heat on inflamed or bruised areas. Heat often brings more relief post needling. o You can continue your regular activities, but vigorous activity is not recommended initially after the treatment for 24 hours. o DN is best combined with other physical therapy such as strengthening, stretching, and  other therapies.   Garen LahLawrie Nazar Kuan, PT Certified Exercise Expert for the Aging Adult  07/29/17 1:45 PM Phone: [phone] Fax: [phone]

## 2017-08-01 ENCOUNTER — Ambulatory Visit: Payer: Medicare Other | Admitting: Physical Therapy

## 2017-08-01 ENCOUNTER — Encounter: Payer: Self-pay | Admitting: Physical Therapy

## 2017-08-01 DIAGNOSIS — R262 Difficulty in walking, not elsewhere classified: Secondary | ICD-10-CM

## 2017-08-01 DIAGNOSIS — G8929 Other chronic pain: Secondary | ICD-10-CM

## 2017-08-01 DIAGNOSIS — M6281 Muscle weakness (generalized): Secondary | ICD-10-CM

## 2017-08-01 DIAGNOSIS — R252 Cramp and spasm: Secondary | ICD-10-CM

## 2017-08-01 DIAGNOSIS — M5440 Lumbago with sciatica, unspecified side: Principal | ICD-10-CM

## 2017-08-01 DIAGNOSIS — R293 Abnormal posture: Secondary | ICD-10-CM
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
## 2017-08-05 ENCOUNTER — Encounter: Payer: Self-pay | Admitting: Physical Therapy

## 2017-08-05 ENCOUNTER — Ambulatory Visit: Payer: Medicare Other | Admitting: Physical Therapy

## 2017-08-05 DIAGNOSIS — R252 Cramp and spasm: Secondary | ICD-10-CM

## 2017-08-05 DIAGNOSIS — R293 Abnormal posture: Secondary | ICD-10-CM

## 2017-08-05 DIAGNOSIS — M5440 Lumbago with sciatica, unspecified side: Secondary | ICD-10-CM | POA: Diagnosis not present

## 2017-08-05 DIAGNOSIS — M6281 Muscle weakness (generalized): Secondary | ICD-10-CM

## 2017-08-05 DIAGNOSIS — G8929 Other chronic pain: Secondary | ICD-10-CM

## 2017-08-05 DIAGNOSIS — R262 Difficulty in walking, not elsewhere classified: Secondary | ICD-10-CM

## 2017-08-05 NOTE — Therapy (Signed)
[place], [place], [address] Phone: [phone]   Fax:  [phone]  Physical Therapy Treatment  Patient Details  Name: Michele [name] MRN: [id number] Date of Birth: 08-02-1960 Referring Provider: Rodell Perna MD   Encounter Date: 08/05/2017  PT End of Session - 08/05/17 1353    Visit Number  7    Number of Visits  16    Date for PT Re-Evaluation  08/28/17    Authorization Type  [employer] Medicare   Progress note on the 10th visit    PT Start Time  1330    PT Stop Time  1415    PT Time Calculation (min)  45 min    Activity Tolerance  Patient tolerated treatment well    Behavior During Therapy  [facility] for tasks assessed/performed       Past Medical History:  Diagnosis Date  . Arthritis   . Asthma   . Hepatitis    C  . Hypertension     Past Surgical History:  Procedure Laterality Date  . BREAST BIOPSY Left 10/14/2006   x2    There were no vitals filed for this visit.  Subjective Assessment - 08/05/17 1335    Subjective  I am using a pillow between my knees and I can now sleep a little on my hips.      How long can you sit comfortably?  able to sit easily though a 30 minutes show    How long can you stand comfortably?  30 to 45 min      How long can you walk comfortably?  30 to 45 min  I am able to mow the lawn myself    Diagnostic tests  xray     Patient Stated Goals  Be able to walk and lie on my left side without pain     Currently in Pain?  Yes    Pain Score  7     Pain Location  Back    Pain Orientation  Mid;Lower    Pain Descriptors / Indicators  Aching    Pain Type  Chronic pain    Pain Onset  More than a month ago    Pain Frequency  Intermittent    Pain Score  4    Pain Location  Hip    Pain Orientation  Right;Left    Pain Descriptors / Indicators  Aching    Pain Type  Chronic pain    Pain Onset  More than a month ago    Pain Frequency  Intermittent                        [facility] Adult PT Treatment/Exercise - 08/05/17 1347      Self-Care   Self-Care  Other Self-Care Comments;Posture;Lifting;ADL's    ADL's  reviewed handout and back relieving postures and techniques for daily activitiies    Lifting  demo lifting 15 # from floor    Posture  reviewed Handout       Lumbar Exercises: Stretches   Lower Trunk Rotation  10 seconds 10 reps    Figure 4 Stretch  3 reps;30 seconds after TPDN able to do without modification left side      Lumbar Exercises: Standing   Wall Slides  10 reps    Other Standing Lumbar Exercises  sink squat x 15 with 3 seconds hold      Lumbar Exercises: Supine   Ab Set  10 reps;5 seconds    Pelvic Tilt  20 reps    Bridge  20 reps    Bridge with [employer]  20 reps;3 seconds ball between knees    Other Supine Lumbar Exercises  dead bug marching x 20      Lumbar Exercises: Prone   Other Prone Lumbar Exercises  prone childs pose x 2 30 sec hold      Lumbar Exercises: Quadruped   Madcat/Old Horse  10 reps x2 with VC and TC    Madcat/Old Horse Limitations  Pt with hypomobiliity in lumbar      Knee/Hip Exercises: Standing   Lateral Step Up  10 reps;Hand Hold: 2;Step Height: 6";Both    Forward Step Up  10 reps;Hand Hold: 2;Step Height: 6";Both      Modalities   Modalities  Iontophoresis      Iontophoresis   Type of Iontophoresis  Dexamethasone    Location  bilateral greater throchanter    Dose  1 ml x 2     Time  6 hour patch             PT Education - 08/05/17 1415    Education Details  added to HEP cat/camel and dead bug modified    Person(s) Educated  Patient    Methods  Explanation;Demonstration;Tactile cues;Handout;Verbal cues    Comprehension  Returned demonstration;Verbalized understanding;Verbal cues required       PT Short Term Goals - 08/05/17 1349      PT SHORT TERM GOAL #1   Title  Pt will be independent with initial HEP     Time  3    Period  Weeks    Status   Achieved      PT SHORT TERM GOAL #2   Title  "Demonstrate understanding of proper sitting posture and be more conscious of position and posture throughout the day.     Baseline  Pt able to demo lifting #15 from floor and verbally give helps in kitchen and mowing lawn examples    Time  3    Period  Weeks    Status  Achieved      PT SHORT TERM GOAL #3   Title  "Report pain decrease from 8/10 to  5 /10 in order to stand and prepare food for at least 15 minutes    Baseline  after RX today 5/10 able to stand for 30 minutes    Time  3    Period  Weeks    Status  Achieved      PT SHORT TERM GOAL #4   Title  Pt will be able to negotiate steps with 50 % greater ease    Baseline  up with alteranating steps and UE support and down one step at a time     Time  3    Period  Weeks    Status  Achieved        PT Long Term Goals - 08/05/17 1351      PT LONG TERM GOAL #1   Title  "Pt will be independent with advanced HEP.     Baseline  beginning advanced program for back and hips    Time  8    Period  Weeks    Status  On-going      PT LONG TERM GOAL #2   Title  "FOTO will improve from 75%limitaiton   to 58 % limitation    indicating improved functional mobility    Time  8    Period  Weeks    Status  Unable to assess      PT LONG TERM GOAL #3   Title  "Demonstrate and verbalize techniques to reduce the risk of re-injury including: lifting, posture, body mechanics.     Baseline  lifting 15 # from floor without exacerbating pain.  Pt able to verbalize sleeping positions and use of posture helps in kitchen and mowing laundry    Time  8    Period  Weeks    Status  Partially Met      PT LONG TERM GOAL #4   Title  Pt will be able to negotiate steps without exacerbating  pain greater thatn 3/10 pain in back and knees    Baseline  Pt with 5/10 pain in back today with negotiating steps step to    Time  8    Period  Weeks    Status  On-going      PT LONG TERM GOAL #5   Title  Pt will be  able to sleep 4 hours of uninterrupted sleep at night and be able to roll onto left side without waking for more restful sleep    Baseline  able to sleep 4  hours using pillow between legs    Time  8    Period  Weeks    Status  Achieved      PT LONG TERM GOAL #6   Title  Pt will be able to pick up 15 lb of groceries from floor without exacerbating back pain    Baseline  able to pick up 15 # stool from grounds    Time  8    Period  Weeks    Status  On-going      PT LONG TERM GOAL #7   Title  Pt will be able to perform household chores such as mowing lawn with pain no greater thtan 3/10 pain    Baseline  mowing lawn at 5/10 pain    Time  8    Period  Weeks    Status  On-going            Plan - 08/05/17 1424    Clinical Impression Statement  Pt pain level in back 7/10 and 4/10 bil hips  at end of session it is 2/10 pain with iontophoresis bil greater trochanter. Pt achieved LTG # 5 and sleeps 4 hours with pillow between legs Pt added to HEP with cat/camel and supine dead bug marching.  Pt reduced pain in back with exercise from 7/10 to 5/10 at end of session.  Pt able to negotiate steps with 50 % greater ease.  All STG's acheived today.  moving onto progressing toward completion of LTG's    Rehab Potential  Good    PT Frequency  2x / week    PT Duration  8 weeks    PT Treatment/Interventions  ADLs/Self Care Home Management;Cryotherapy;Electrical Stimulation;Iontophoresis 39m/ml Dexamethasone;Moist Heat;Traction;Ultrasound;Therapeutic exercise;Therapeutic activities;Functional mobility training;Gait training;Stair training;Neuromuscular re-education;Patient/family education;Manual techniques;Passive range of motion;Dry needling;Taping    PT Next Visit Plan  assess ionto to hips /Dorothy SparkHEP, check LTG # 2 for ADL/ posture and body mechanis   FOTO taken soon ,  assess  ionto patches to bilateral hips next visit. painfree glute strength-avoid side lying for now    PT Home Exercise Plan   Mckenzie prone on elbow, standing ext and lateral shift to right for left side pain, LAQ, SLR, pelvic tilit, LTR, knee to  chest, piriformis stretch, figure 4 stretch , hip flexor stretch, tennis ball to piriformis, sit to stand, green band supine clams  cat/camel, dead bug    Consulted and Agree with Plan of Care  Patient       Patient will benefit from skilled therapeutic intervention in order to improve the following deficits and impairments:  Abnormal gait, Decreased mobility, Decreased range of motion, Difficulty walking, Decreased strength, Hypomobility, Increased muscle spasms, Obesity, Postural dysfunction, Improper body mechanics, Pain  Visit Diagnosis: Chronic left-sided low back pain with sciatica, sciatica laterality unspecified  Muscle weakness (generalized)  Abnormal posture  Cramp and spasm  Difficulty in walking, not elsewhere classified     Problem List Patient Active Problem List   Diagnosis Date Noted  . History of total knee arthroplasty 06/24/2017   Voncille Lo, PT Certified Exercise Expert for the Aging Adult  08/05/17 2:34 PM Phone: [phone] Fax: [facility] [address] [place], [place], [address] Phone: [phone]   Fax:  [phone]  Name: NEIRA BENTSEN MRN: [id number] Date of Birth: 10-06-1960

## 2017-08-05 NOTE — Patient Instructions (Signed)
       Garen LahLawrie Beardsley, PT Certified Exercise Expert for the Aging Adult  08/05/17 2:15 PM Phone: [phone] Fax: [phone]

## 2017-08-07 ENCOUNTER — Ambulatory Visit: Payer: Medicare Other | Admitting: Physical Therapy

## 2017-08-12 ENCOUNTER — Ambulatory Visit: Payer: Medicare Other | Admitting: Physical Therapy

## 2017-08-12 ENCOUNTER — Encounter: Payer: Self-pay | Admitting: Physical Therapy

## 2017-08-12 DIAGNOSIS — M5440 Lumbago with sciatica, unspecified side: Principal | ICD-10-CM

## 2017-08-12 DIAGNOSIS — R262 Difficulty in walking, not elsewhere classified: Secondary | ICD-10-CM

## 2017-08-12 DIAGNOSIS — R293 Abnormal posture: Secondary | ICD-10-CM

## 2017-08-12 DIAGNOSIS — R252 Cramp and spasm: Secondary | ICD-10-CM

## 2017-08-12 DIAGNOSIS — G8929 Other chronic pain: Secondary | ICD-10-CM

## 2017-08-12 DIAGNOSIS — M6281 Muscle weakness (generalized): Secondary | ICD-10-CM
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
## 2017-08-12 NOTE — Patient Instructions (Addendum)
   Garen LahLawrie Brinley Treanor, PT Certified Exercise Expert for the Aging Adult  08/12/17 2:14 PM Phone: [phone] Fax: [phone]

## 2017-08-14 ENCOUNTER — Ambulatory Visit: Payer: Medicare Other | Admitting: Physical Therapy

## 2017-08-18 ENCOUNTER — Other Ambulatory Visit: Payer: Self-pay | Admitting: Nurse Practitioner

## 2017-08-18 DIAGNOSIS — K7400 Hepatic fibrosis, unspecified: Secondary | ICD-10-CM

## 2017-08-19 ENCOUNTER — Ambulatory Visit: Payer: Medicare Other | Attending: Orthopaedic Surgery | Admitting: Physical Therapy

## 2017-08-19 ENCOUNTER — Encounter: Payer: Self-pay | Admitting: Physical Therapy

## 2017-08-19 DIAGNOSIS — R293 Abnormal posture: Secondary | ICD-10-CM | POA: Diagnosis present

## 2017-08-19 DIAGNOSIS — G8929 Other chronic pain: Secondary | ICD-10-CM | POA: Diagnosis present

## 2017-08-19 DIAGNOSIS — M6281 Muscle weakness (generalized): Secondary | ICD-10-CM | POA: Insufficient documentation

## 2017-08-19 DIAGNOSIS — R262 Difficulty in walking, not elsewhere classified: Secondary | ICD-10-CM | POA: Diagnosis present

## 2017-08-19 DIAGNOSIS — R252 Cramp and spasm: Secondary | ICD-10-CM | POA: Insufficient documentation

## 2017-08-19 DIAGNOSIS — M5440 Lumbago with sciatica, unspecified side: Secondary | ICD-10-CM | POA: Diagnosis present

## 2017-08-19 NOTE — Patient Instructions (Addendum)
WALKING  Walking is a great form of exercise to increase your strength, endurance and overall fitness.  A walking program can help you start slowly and gradually build endurance as you go.  Everyone's ability is different, so each person's starting point will be different.  You do not have to follow them exactly.  The are just samples. You should simply find out what's right for you and stick to that program.   In the beginning, you'll start off walking 2-3 times a day for short distances.  As you get stronger, you'll be walking further at just 1-2 times per day.  A. You Can Walk For A Certain Length Of Time Each Day    Walk 5 minutes 3 times per day.  Increase 2 minutes every 2 days (3 times per day).  Work up to 25-30 minutes (1-2 times per day).   Example:   Day 1-2 5 minutes 3 times per day   Day 7-8 12 minutes 2-3 times per day   Day 13-14 25 minutes 1-2 times per day  B. You Can Walk For a Certain Distance Each Day     Distance can be substituted for time.    Example:   3 trips to mailbox (at road)   3 trips to corner of block   3 trips around the block  C. Go to local [employer] and use the track.    Walk for distance ____ around track  Or time _30___ minutes  Work up to 45 minutes 5-6 days aweek with the  RPEabout 13/14  D. Walk _x___ Jog ____ Run ___  Please only do the exercises that your therapist has initialed and dated  Given LEt's get Active handout with RPE  Michele LahLawrie Araiya [name], PT Certified Exercise Expert for the Aging Adult  08/19/17 1:44 PM Phone: [phone] Fax: [phone] [name]
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
## 2017-08-21 ENCOUNTER — Ambulatory Visit: Payer: Medicare Other | Admitting: Physical Therapy

## 2017-08-26 ENCOUNTER — Encounter: Payer: Self-pay | Admitting: Physical Therapy

## 2017-08-26 ENCOUNTER — Ambulatory Visit
Admission: RE | Admit: 2017-08-26 | Discharge: 2017-08-26 | Disposition: A | Discharge status: Home / Self Care | Payer: Medicare Other | Source: Ambulatory Visit | Attending: Nurse Practitioner | Admitting: Nurse Practitioner

## 2017-08-26 DIAGNOSIS — K7400 Hepatic fibrosis, unspecified: Secondary | ICD-10-CM

## 2017-08-28 ENCOUNTER — Encounter: Payer: Self-pay | Admitting: Physical Therapy

## 2017-09-02 ENCOUNTER — Encounter: Payer: Self-pay | Admitting: Physical Therapy

## 2017-09-04 ENCOUNTER — Ambulatory Visit: Payer: Medicare Other | Admitting: Physical Therapy

## 2017-11-26 ENCOUNTER — Other Ambulatory Visit: Payer: Self-pay | Admitting: Internal Medicine

## 2017-11-26 DIAGNOSIS — Z1231 Encounter for screening mammogram for malignant neoplasm of breast: Secondary | ICD-10-CM

## 2018-01-12 ENCOUNTER — Ambulatory Visit
Admission: RE | Admit: 2018-01-12 | Discharge: 2018-01-12 | Disposition: A | Discharge status: Home / Self Care | Payer: Medicare Other | Source: Ambulatory Visit | Attending: Internal Medicine | Admitting: Internal Medicine

## 2018-01-12 DIAGNOSIS — Z1231 Encounter for screening mammogram for malignant neoplasm of breast: Secondary | ICD-10-CM

## 2018-02-10 ENCOUNTER — Other Ambulatory Visit: Payer: Self-pay | Admitting: Nurse Practitioner

## 2018-02-10 DIAGNOSIS — K7400 Hepatic fibrosis, unspecified: Secondary | ICD-10-CM

## 2018-02-13 ENCOUNTER — Ambulatory Visit
Admission: RE | Admit: 2018-02-13 | Discharge: 2018-02-13 | Disposition: A | Discharge status: Home / Self Care | Payer: Medicare Other | Source: Ambulatory Visit | Attending: Nurse Practitioner | Admitting: Nurse Practitioner

## 2018-02-13 DIAGNOSIS — K7400 Hepatic fibrosis, unspecified: Secondary | ICD-10-CM

## 2018-06-25 ENCOUNTER — Encounter (HOSPITAL_COMMUNITY): Payer: Self-pay

## 2018-06-26 DIAGNOSIS — I10 Essential (primary) hypertension: Secondary | ICD-10-CM | POA: Insufficient documentation

## 2018-07-02 DIAGNOSIS — K759 Inflammatory liver disease, unspecified: Secondary | ICD-10-CM | POA: Insufficient documentation

## 2019-06-19 IMAGING — MG DIGITAL SCREENING BILATERAL MAMMOGRAM WITH CAD
5 series · 5 of 5 positions shown · non-contrast
Comparison: Previous exam(s).

CLINICAL DATA: Screening.

EXAM:
DIGITAL SCREENING BILATERAL MAMMOGRAM WITH CAD

[R MLO]
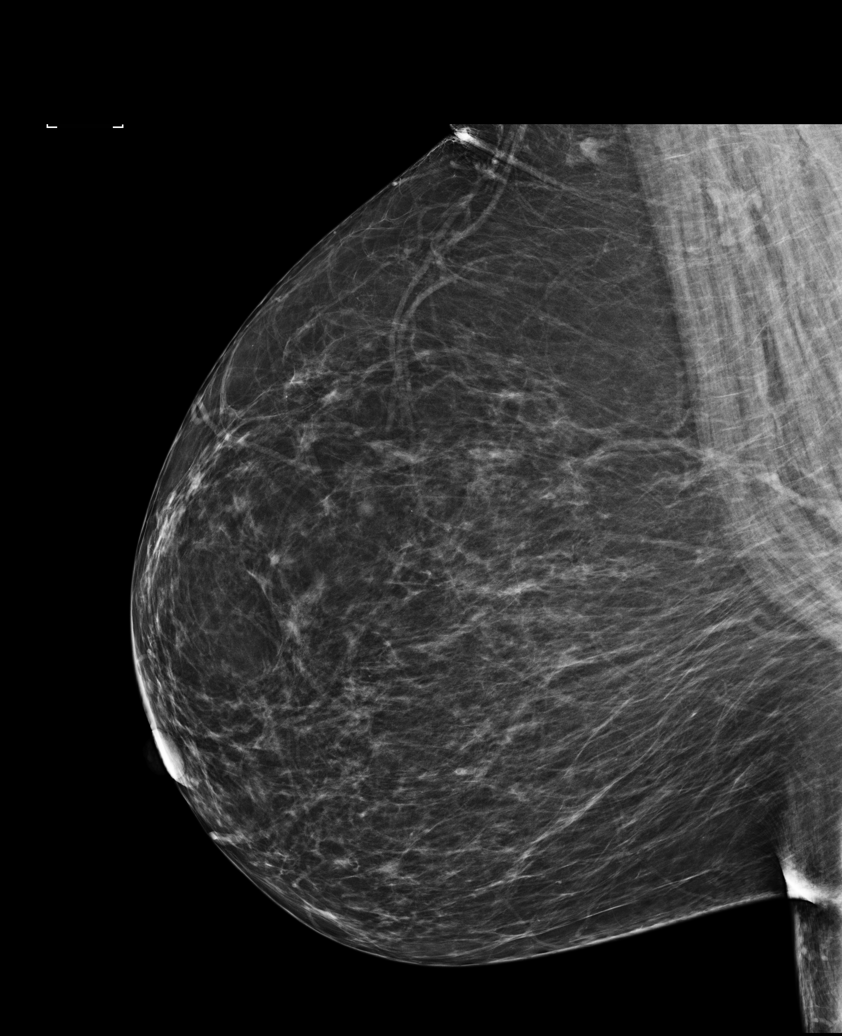

[L CC (1 of 2)]
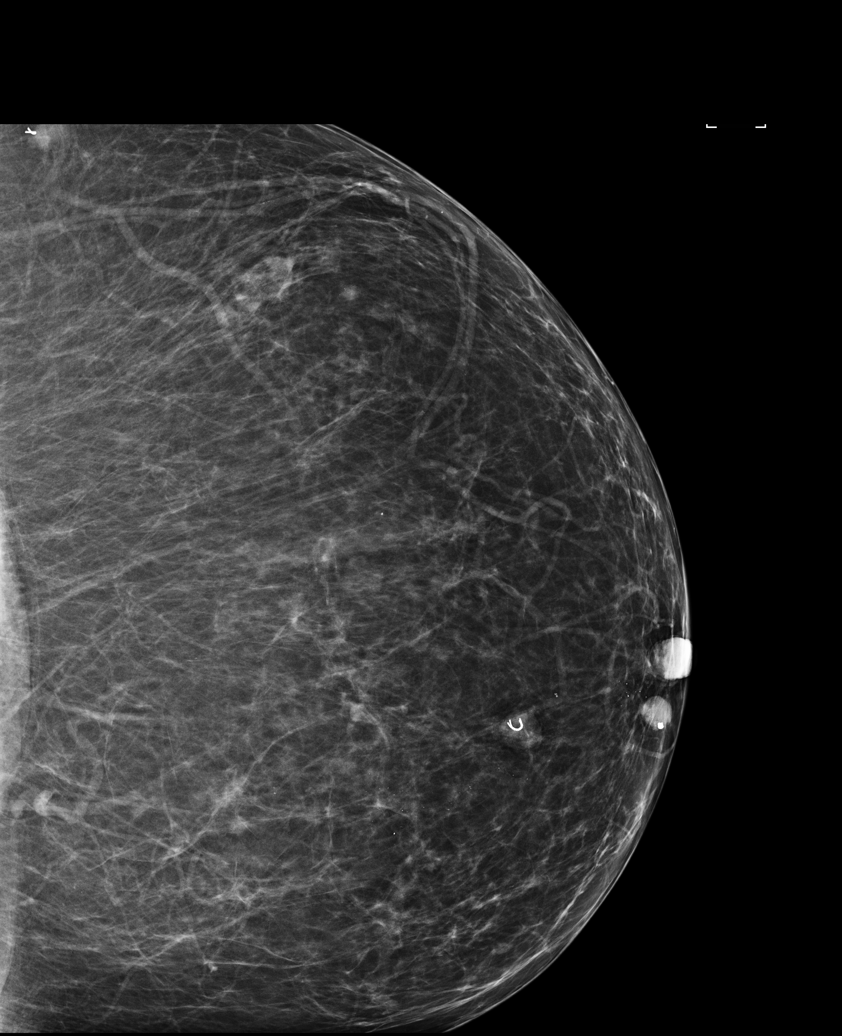

[L MLO]
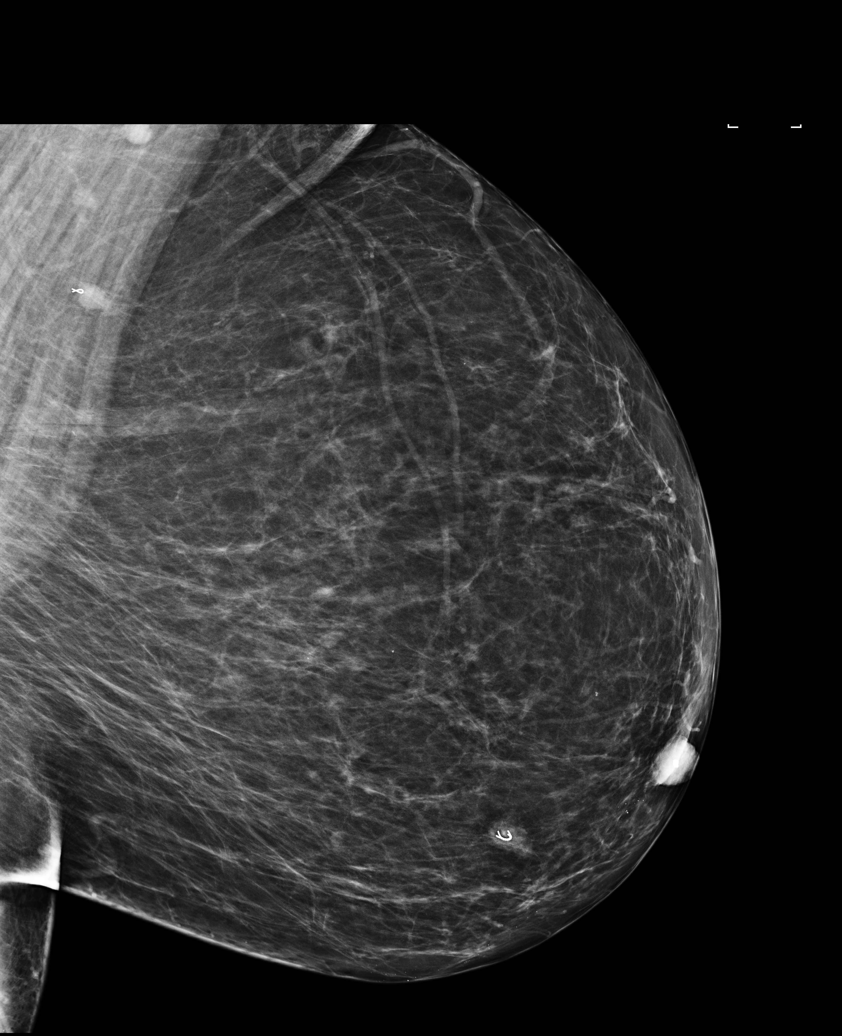

[L CC (2 of 2)]
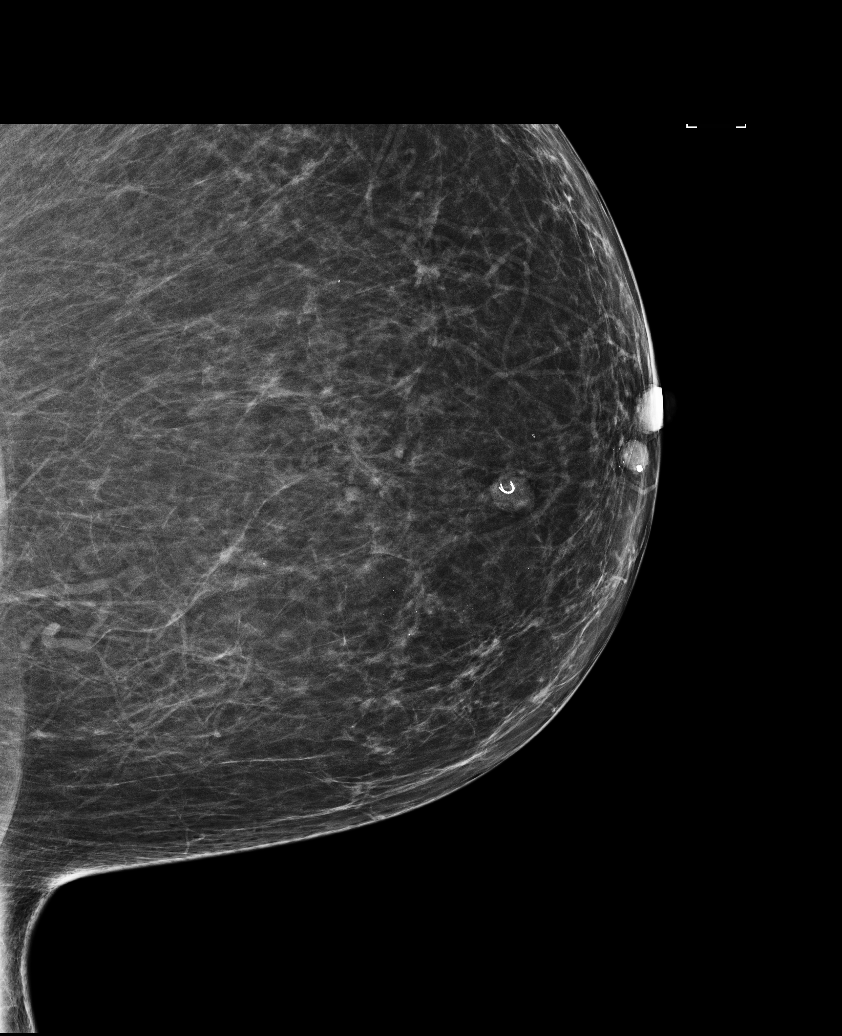

[R CC]
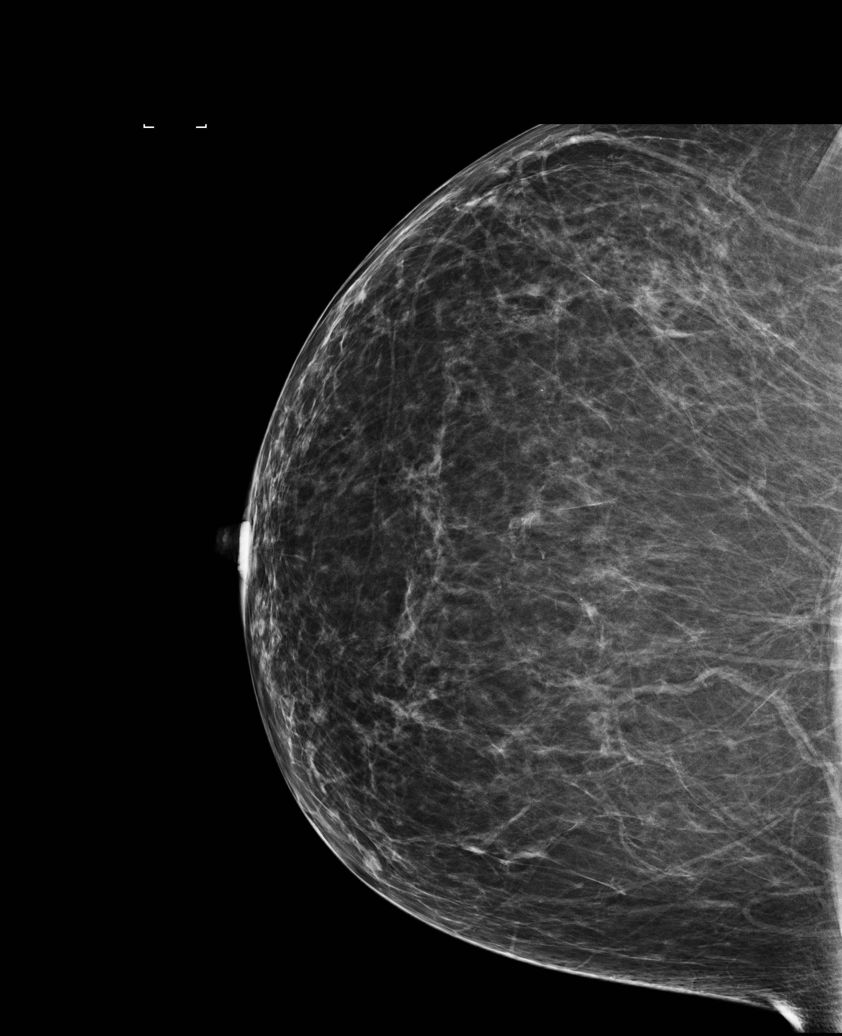

[5 of 5 positions shown; findings below may reference images not displayed]

ACR Breast Density Category b: There are scattered areas of
fibroglandular density.
FINDINGS: There are no findings suspicious for malignancy. Images were
processed with CAD.
IMPRESSION: No mammographic evidence of malignancy. A result letter of this
screening mammogram will be mailed directly to the patient.

RECOMMENDATION:
Screening mammogram in one year. (Code:AS-G-LCT)

BI-RADS CATEGORY  1: Negative.

## 2019-07-15 ENCOUNTER — Other Ambulatory Visit: Payer: Self-pay | Admitting: Nurse Practitioner

## 2019-07-15 DIAGNOSIS — K7469 Other cirrhosis of liver: Secondary | ICD-10-CM

## 2019-07-30 ENCOUNTER — Ambulatory Visit
Admission: RE | Admit: 2019-07-30 | Discharge: 2019-07-30 | Disposition: A | Discharge status: Home / Self Care | Payer: Medicare Other | Source: Ambulatory Visit | Attending: Nurse Practitioner | Admitting: Nurse Practitioner

## 2019-07-30 DIAGNOSIS — K7469 Other cirrhosis of liver: Secondary | ICD-10-CM

## 2019-11-30 ENCOUNTER — Other Ambulatory Visit: Payer: Self-pay | Admitting: Internal Medicine

## 2019-11-30 DIAGNOSIS — Z1231 Encounter for screening mammogram for malignant neoplasm of breast: Secondary | ICD-10-CM

## 2020-01-24 ENCOUNTER — Ambulatory Visit: Payer: Medicare Other

## 2020-01-26 ENCOUNTER — Ambulatory Visit: Payer: Medicare Other

## 2020-01-28 ENCOUNTER — Ambulatory Visit
Admission: RE | Admit: 2020-01-28 | Discharge: 2020-01-28 | Disposition: A | Discharge status: Home / Self Care | Payer: Medicare Other | Source: Ambulatory Visit | Attending: Internal Medicine | Admitting: Internal Medicine

## 2020-01-28 ENCOUNTER — Other Ambulatory Visit: Payer: Self-pay

## 2020-01-28 DIAGNOSIS — Z1231 Encounter for screening mammogram for malignant neoplasm of breast: Secondary | ICD-10-CM

## 2020-02-02 ENCOUNTER — Other Ambulatory Visit: Payer: Self-pay | Admitting: Nurse Practitioner

## 2020-02-02 DIAGNOSIS — K7469 Other cirrhosis of liver: Secondary | ICD-10-CM

## 2020-02-16 ENCOUNTER — Inpatient Hospital Stay: Admission: RE | Admit: 2020-02-16 | Payer: Medicare Other | Source: Ambulatory Visit

## 2020-02-29 ENCOUNTER — Ambulatory Visit
Admission: RE | Admit: 2020-02-29 | Discharge: 2020-02-29 | Disposition: A | Discharge status: Home / Self Care | Payer: Medicare Other | Source: Ambulatory Visit | Attending: Nurse Practitioner | Admitting: Nurse Practitioner

## 2020-02-29 DIAGNOSIS — K7469 Other cirrhosis of liver: Secondary | ICD-10-CM

## 2020-03-10 ENCOUNTER — Other Ambulatory Visit: Payer: Self-pay | Admitting: Internal Medicine

## 2020-03-11 LAB — COMPLETE METABOLIC PANEL WITH GFR
AG Ratio: 1.3 (calc) (ref 1.0–2.5)
ALT: 35 U/L — ABNORMAL HIGH (ref 6–29)
AST: 24 U/L (ref 10–35)
Albumin: 3.6 g/dL (ref 3.6–5.1)
Alkaline phosphatase (APISO): 34 U/L — ABNORMAL LOW (ref 37–153)
BUN: 17 mg/dL (ref 7–25)
CO2: 25 mmol/L (ref 20–32)
Calcium: 9.4 mg/dL (ref 8.6–10.4)
Chloride: 104 mmol/L (ref 98–110)
Creat: 0.78 mg/dL (ref 0.50–0.99)
GFR, Est African American: 96 mL/min/{1.73_m2} (ref >=60)
GFR, Est Non African American: 83 mL/min/{1.73_m2} (ref >=60)
Globulin: 2.8 g/dL (calc) (ref 1.9–3.7)
Glucose, Bld: 83 mg/dL (ref 65–99)
Potassium: 4.5 mmol/L (ref 3.5–5.3)
Sodium: 140 mmol/L (ref 135–146)
Total Bilirubin: 0.3 mg/dL (ref 0.2–1.2)
Total Protein: 6.4 g/dL (ref 6.1–8.1)

## 2020-03-11 LAB — VITAMIN D 25 HYDROXY (VIT D DEFICIENCY, FRACTURES): Vit D, 25-Hydroxy: 53 ng/mL (ref 30–100)

## 2020-03-11 LAB — CBC
HCT: 33 % — ABNORMAL LOW (ref 35.0–45.0)
Hemoglobin: 11 g/dL — ABNORMAL LOW (ref 11.7–15.5)
MCH: 32.8 pg (ref 27.0–33.0)
MCHC: 33.3 g/dL (ref 32.0–36.0)
MCV: 98.5 fL (ref 80.0–100.0)
MPV: 10.1 fL (ref 7.5–12.5)
Platelets: 210 10*3/uL (ref 140–400)
RBC: 3.35 10*6/uL — ABNORMAL LOW (ref 3.80–5.10)
RDW: 13.1 % (ref 11.0–15.0)
WBC: 6 10*3/uL (ref 3.8–10.8)

## 2020-03-11 LAB — LIPID PANEL
Cholesterol: 164 mg/dL (ref <200)
HDL: 86 mg/dL (ref >=50)
LDL Cholesterol (Calc): 64 mg/dL (calc)
Non-HDL Cholesterol (Calc): 78 mg/dL (calc) (ref <130)
Total CHOL/HDL Ratio: 1.9 (calc) (ref <5.0)
Triglycerides: 48 mg/dL (ref <150)

## 2020-03-11 LAB — TSH: TSH: 3.46 mIU/L (ref 0.40–4.50)

## 2021-02-14 ENCOUNTER — Other Ambulatory Visit: Payer: Self-pay | Admitting: Nurse Practitioner

## 2021-02-14 DIAGNOSIS — K760 Fatty (change of) liver, not elsewhere classified: Secondary | ICD-10-CM

## 2021-02-14 DIAGNOSIS — K7402 Hepatic fibrosis, advanced fibrosis: Secondary | ICD-10-CM

## 2021-02-27 ENCOUNTER — Other Ambulatory Visit: Payer: Medicare Other

## 2021-03-06 ENCOUNTER — Ambulatory Visit
Admission: RE | Admit: 2021-03-06 | Discharge: 2021-03-06 | Disposition: A | Discharge status: Home / Self Care | Payer: Medicare Other | Source: Ambulatory Visit | Attending: Nurse Practitioner | Admitting: Nurse Practitioner

## 2021-03-06 DIAGNOSIS — K760 Fatty (change of) liver, not elsewhere classified: Secondary | ICD-10-CM

## 2021-03-06 DIAGNOSIS — K7402 Hepatic fibrosis, advanced fibrosis: Secondary | ICD-10-CM

## 2021-03-21 ENCOUNTER — Other Ambulatory Visit: Payer: Self-pay | Admitting: Internal Medicine

## 2021-03-21 DIAGNOSIS — Z1231 Encounter for screening mammogram for malignant neoplasm of breast: Secondary | ICD-10-CM

## 2021-04-11 ENCOUNTER — Ambulatory Visit
Admission: RE | Admit: 2021-04-11 | Discharge: 2021-04-11 | Disposition: A | Discharge status: Home / Self Care | Payer: Medicare Other | Source: Ambulatory Visit | Attending: Internal Medicine | Admitting: Internal Medicine

## 2021-04-11 DIAGNOSIS — Z1231 Encounter for screening mammogram for malignant neoplasm of breast: Secondary | ICD-10-CM

## 2021-05-31 ENCOUNTER — Other Ambulatory Visit: Payer: Self-pay | Admitting: Internal Medicine

## 2021-06-01 LAB — COMPLETE METABOLIC PANEL WITH GFR
AG Ratio: 1.3 (calc) (ref 1.0–2.5)
ALT: 33 U/L — ABNORMAL HIGH (ref 6–29)
AST: 28 U/L (ref 10–35)
Albumin: 4.1 g/dL (ref 3.6–5.1)
Alkaline phosphatase (APISO): 39 U/L (ref 37–153)
BUN: 14 mg/dL (ref 7–25)
CO2: 25 mmol/L (ref 20–32)
Calcium: 9.6 mg/dL (ref 8.6–10.4)
Chloride: 104 mmol/L (ref 98–110)
Creat: 0.71 mg/dL (ref 0.50–1.05)
Globulin: 3.1 g/dL (calc) (ref 1.9–3.7)
Glucose, Bld: 104 mg/dL — ABNORMAL HIGH (ref 65–99)
Potassium: 4.1 mmol/L (ref 3.5–5.3)
Sodium: 140 mmol/L (ref 135–146)
Total Bilirubin: 0.4 mg/dL (ref 0.2–1.2)
Total Protein: 7.2 g/dL (ref 6.1–8.1)
eGFR: 97 mL/min/{1.73_m2} (ref >=60)

## 2021-06-01 LAB — CBC
HCT: 37.5 % (ref 35.0–45.0)
Hemoglobin: 12.6 g/dL (ref 11.7–15.5)
MCH: 31.5 pg (ref 27.0–33.0)
MCHC: 33.6 g/dL (ref 32.0–36.0)
MCV: 93.8 fL (ref 80.0–100.0)
MPV: 9.7 fL (ref 7.5–12.5)
Platelets: 259 10*3/uL (ref 140–400)
RBC: 4 10*6/uL (ref 3.80–5.10)
RDW: 12.6 % (ref 11.0–15.0)
WBC: 5.5 10*3/uL (ref 3.8–10.8)

## 2021-06-01 LAB — LIPID PANEL
Cholesterol: 166 mg/dL (ref <200)
HDL: 61 mg/dL (ref >=50)
LDL Cholesterol (Calc): 86 mg/dL (calc)
Non-HDL Cholesterol (Calc): 105 mg/dL (calc) (ref <130)
Total CHOL/HDL Ratio: 2.7 (calc) (ref <5.0)
Triglycerides: 97 mg/dL (ref <150)

## 2021-06-01 LAB — TSH: TSH: 0.79 mIU/L (ref 0.40–4.50)

## 2021-06-01 LAB — VITAMIN D 25 HYDROXY (VIT D DEFICIENCY, FRACTURES): Vit D, 25-Hydroxy: 51 ng/mL (ref 30–100)

## 2021-08-21 ENCOUNTER — Other Ambulatory Visit: Payer: Self-pay | Admitting: Nurse Practitioner

## 2021-08-21 DIAGNOSIS — K7402 Hepatic fibrosis, advanced fibrosis: Secondary | ICD-10-CM

## 2021-08-21 DIAGNOSIS — K760 Fatty (change of) liver, not elsewhere classified: Secondary | ICD-10-CM

## 2021-08-27 ENCOUNTER — Ambulatory Visit
Admission: RE | Admit: 2021-08-27 | Discharge: 2021-08-27 | Disposition: A | Discharge status: Home / Self Care | Payer: Medicare Other | Source: Ambulatory Visit | Attending: Nurse Practitioner | Admitting: Nurse Practitioner

## 2021-08-27 DIAGNOSIS — K7402 Hepatic fibrosis, advanced fibrosis: Secondary | ICD-10-CM

## 2021-08-27 DIAGNOSIS — K760 Fatty (change of) liver, not elsewhere classified: Secondary | ICD-10-CM

## 2022-03-14 ENCOUNTER — Encounter: Payer: Self-pay | Admitting: Nurse Practitioner

## 2022-03-14 ENCOUNTER — Other Ambulatory Visit: Payer: Self-pay | Admitting: Nurse Practitioner

## 2022-03-14 DIAGNOSIS — K7402 Hepatic fibrosis, advanced fibrosis: Secondary | ICD-10-CM

## 2022-03-14 DIAGNOSIS — K760 Fatty (change of) liver, not elsewhere classified: Secondary | ICD-10-CM

## 2022-04-02 ENCOUNTER — Ambulatory Visit (INDEPENDENT_AMBULATORY_CARE_PROVIDER_SITE_OTHER): Payer: 59 | Admitting: Podiatry

## 2022-04-02 DIAGNOSIS — M2041 Other hammer toe(s) (acquired), right foot: Secondary | ICD-10-CM

## 2022-04-02 NOTE — Progress Notes (Signed)
  Subjective:  Patient ID: Michele [name], female    DOB: [birth year],  MRN: [id number]  Chief Complaint  Patient presents with   Toe Pain    Right foot 2nd toe pain     62 y.o. female presents with the above complaint.  Patient presents with right second digit hammertoe contracture.  Patient states painful to touch arch with ambulation worse with pressure hammertoe contracture.  Patient states that it hurts with ambulation worse with pressure she wanted to discuss treatment options for it.  She is not ready for surgery she wanted discuss conservative options.   Review of Systems: Negative except as noted in the HPI. Denies N/V/F/Ch.  Past Medical History:  Diagnosis Date   Arthritis    Asthma    Hepatitis    C   Hypertension     Current Outpatient Medications:    atorvastatin (LIPITOR) 80 MG tablet, Take 80 mg by mouth every evening. , Disp: , Rfl:    hydrochlorothiazide (HYDRODIURIL) 12.5 MG tablet, Take 12.5 mg by mouth daily., Disp: , Rfl:    ibuprofen (ADVIL,MOTRIN) 800 MG tablet, Take 1 tablet (800 mg total) by mouth every 8 (eight) hours as needed. (Patient not taking: Reported on 07/03/2017), Disp: 30 tablet, Rfl: 0   linaclotide (LINZESS) 145 MCG CAPS capsule, Take 145 mcg by mouth daily before breakfast. , Disp: , Rfl:   Social History   Tobacco Use  Smoking Status Former   Types: Cigarettes   Quit date: 10/16/2016   Years since quitting: 5.4  Smokeless Tobacco Never    No Known Allergies Objective:  There were no vitals filed for this visit. There is no height or weight on file to calculate BMI. Constitutional Well developed. Well nourished.  Vascular Dorsalis pedis pulses palpable bilaterally. Posterior tibial pulses palpable bilaterally. Capillary refill normal to all digits.  No cyanosis or clubbing noted. Pedal hair growth normal.  Neurologic Normal speech. Oriented to person, place, and time. Epicritic sensation to light touch grossly present  bilaterally.  Dermatologic Nails well groomed and normal in appearance. No open wounds. No skin lesions.  Orthopedic: Right second digit hammertoe contracture noted semiflexible in nature mild most pain at the PIPJ joint.  Some metatarsophalangeal joint contracture noted of the second.  No extensor or flexor tendinitis noted no plantar plate attenuation noted   Radiographs: None Assessment:   1. Hammertoe of second toe of right foot    Plan:  Patient was evaluated and treated and all questions answered.  Right second hammertoe contracture -All questions and concerns were discussed with the patient in extensive detail. -Given the amount of time that she has been dealing with this I discussed some surgical options with her.  At this time I also discussed shoe gear modification and toe protectors were dispensed.  She will think about the surgery and will get back to me when she is ready  No follow-ups on file.

## 2022-04-15 ENCOUNTER — Ambulatory Visit
Admission: RE | Admit: 2022-04-15 | Discharge: 2022-04-15 | Disposition: A | Discharge status: Home / Self Care | Payer: 59 | Source: Ambulatory Visit | Attending: Nurse Practitioner | Admitting: Nurse Practitioner

## 2022-04-15 DIAGNOSIS — K7402 Hepatic fibrosis, advanced fibrosis: Secondary | ICD-10-CM

## 2022-04-15 DIAGNOSIS — K760 Fatty (change of) liver, not elsewhere classified: Secondary | ICD-10-CM

## 2022-04-26 ENCOUNTER — Other Ambulatory Visit: Payer: Self-pay | Admitting: Internal Medicine

## 2022-04-27 LAB — COMPLETE METABOLIC PANEL WITH GFR
AG Ratio: 1.3 (calc) (ref 1.0–2.5)
ALT: 43 U/L — ABNORMAL HIGH (ref 6–29)
AST: 44 U/L — ABNORMAL HIGH (ref 10–35)
Albumin: 4 g/dL (ref 3.6–5.1)
Alkaline phosphatase (APISO): 44 U/L (ref 37–153)
BUN: 13 mg/dL (ref 7–25)
CO2: 23 mmol/L (ref 20–32)
Calcium: 9.6 mg/dL (ref 8.6–10.4)
Chloride: 105 mmol/L (ref 98–110)
Creat: 0.63 mg/dL (ref 0.50–1.05)
Globulin: 3.1 g/dL (calc) (ref 1.9–3.7)
Glucose, Bld: 143 mg/dL — ABNORMAL HIGH (ref 65–99)
Potassium: 4.5 mmol/L (ref 3.5–5.3)
Sodium: 139 mmol/L (ref 135–146)
Total Bilirubin: 0.2 mg/dL (ref 0.2–1.2)
Total Protein: 7.1 g/dL (ref 6.1–8.1)
eGFR: 100 mL/min/{1.73_m2} (ref >=60)

## 2022-04-27 LAB — LIPID PANEL
Cholesterol: 158 mg/dL (ref <200)
HDL: 64 mg/dL (ref >=50)
LDL Cholesterol (Calc): 77 mg/dL (calc)
Non-HDL Cholesterol (Calc): 94 mg/dL (calc) (ref <130)
Total CHOL/HDL Ratio: 2.5 (calc) (ref <5.0)
Triglycerides: 87 mg/dL (ref <150)

## 2022-04-27 LAB — CBC
HCT: 36.8 % (ref 35.0–45.0)
Hemoglobin: 12.4 g/dL (ref 11.7–15.5)
MCH: 31.6 pg (ref 27.0–33.0)
MCHC: 33.7 g/dL (ref 32.0–36.0)
MCV: 93.6 fL (ref 80.0–100.0)
MPV: 10.2 fL (ref 7.5–12.5)
Platelets: 267 10*3/uL (ref 140–400)
RBC: 3.93 10*6/uL (ref 3.80–5.10)
RDW: 12.8 % (ref 11.0–15.0)
WBC: 6.4 10*3/uL (ref 3.8–10.8)

## 2022-04-27 LAB — VITAMIN D 25 HYDROXY (VIT D DEFICIENCY, FRACTURES): Vit D, 25-Hydroxy: 39 ng/mL (ref 30–100)

## 2022-04-27 LAB — TSH: TSH: 1.23 mIU/L (ref 0.40–4.50)

## 2022-08-14 ENCOUNTER — Ambulatory Visit (HOSPITAL_COMMUNITY)
Admission: EM | Admit: 2022-08-14 | Discharge: 2022-08-14 | Disposition: A | Discharge status: Home / Self Care | Payer: 59

## 2022-08-14 ENCOUNTER — Encounter (HOSPITAL_COMMUNITY): Payer: Self-pay

## 2022-08-14 ENCOUNTER — Telehealth (HOSPITAL_COMMUNITY): Payer: Self-pay | Admitting: Emergency Medicine

## 2022-08-14 DIAGNOSIS — H6121 Impacted cerumen, right ear: Secondary | ICD-10-CM

## 2022-08-14 MED ORDER — OFLOXACIN 0.3 % OT SOLN: 10.0000 [drp] | Freq: Every day | OTIC | 0 refills | Status: AC

## 2022-08-14 MED ORDER — CARBAMIDE PEROXIDE 6.5 % OT SOLN: 5.0000 [drp] | Freq: Two times a day (BID) | OTIC | 0 refills | Status: AC

## 2022-08-14 NOTE — Discharge Instructions (Addendum)
We irrigated some wax out of your right ear.  You have drainage some of the external ear canal, I am going to put you on antibiotic drops to ensure that this does not get infected.  In the future, please do not use tweezers to attempt to remove wax, as this can harm your ear.  You can use the over-the-counter wax softening drops that I have sent in to the pharmacy to help loosen up earwax.  Return to clinic for new or urgent symptoms.

## 2022-08-14 NOTE — ED Provider Notes (Signed)
[facility]    CSN: [id number] Arrival date & time: 08/14/22  1321      History   Chief Complaint Chief Complaint  Patient presents with   Ear Fullness    HPI Michele [name] is a 62 y.o. female.   Patient presents to clinic for right ear fullness and soreness that been present for the past 2 days.  She was taking a shower 2 nights ago and using a wash rag to manually remove the wax when she noticed that her ear felt full and sore, she is getting a little bit of wax out on her washcloth but not much.  She needs to use a Q-tip so she used a pair of tweezers to try and manually remove the wax, she got a little bit of wax out.  Denies any drainage or fevers.  Denies any left ear pain.   The history is provided by the patient and medical records.  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      Home Medications    Prior to Admission medications   Medication Sig Start Date End Date Taking? Authorizing Provider  albuterol (VENTOLIN HFA) 108 (90 Base) MCG/ACT inhaler Inhale 2 puffs into the lungs every 6 (six) hours as needed for wheezing or shortness of breath.   Yes [provider]  amLODipine (NORVASC) 5 MG tablet Take 5 mg by mouth daily.   Yes [provider]  atorvastatin (LIPITOR) 80 MG tablet Take 80 mg by mouth every evening.    Yes [provider]  carbamide peroxide (DEBROX) 6.5 % OTIC solution Place 5 drops into both ears 2 (two) times daily. 08/14/22  Yes Rinaldo Ratel, [place] N, FNP  Cholecalciferol ([employer] VITAMIN D3) 250 MCG (10000 UT) CAPS Take by mouth.   Yes [provider]  gabapentin (NEURONTIN) 600 MG tablet Take 600 mg by mouth 3 (three) times daily.   Yes [provider]  hydrochlorothiazide (HYDRODIURIL) 12.5 MG tablet Take 12.5 mg by mouth daily.   Yes [provider]  ibuprofen (ADVIL,MOTRIN) 800 MG tablet Take 1 tablet (800 mg total) by mouth every 8 (eight) hours as needed. 07/05/14  Yes Ozella Rocks, MD  linaclotide [facility] 145 MCG CAPS capsule Take 145 mcg by mouth daily before breakfast.    Yes [provider]  losartan (COZAAR) 25 MG tablet Take 25 mg by mouth daily.   Yes [provider]  ofloxacin (FLOXIN) 0.3 % OTIC solution Place 10 drops into the right ear daily for 7 days. 08/14/22 08/21/22 Yes Erilyn Pearman, [place] N, FNP    Family History Family History  Problem Relation Age of Onset   Glaucoma Mother    Cataracts Sister    Macular degeneration Sister    Diabetes Sister    Amblyopia Neg Hx    Blindness Neg Hx    Retinal detachment Neg Hx    Stroke Neg Hx    Retinitis pigmentosa Neg Hx    Breast cancer Neg Hx  Social History Social History   Tobacco Use   Smoking status: Former    Current packs/day: 0.00    Types: Cigarettes    Quit date: 10/16/2016    Years since quitting: 5.8   Smokeless tobacco: Never  Vaping Use   Vaping status: Never Used  Substance Use Topics   Alcohol use: No   Drug use: No     Allergies   Patient has no known allergies.   Review of Systems Review of Systems   Physical Exam Triage Vital Signs ED Triage Vitals [08/14/22 1452]  Encounter Vitals Group     BP 114/73     Systolic BP Percentile      Diastolic BP Percentile      Pulse Rate 83     Resp 16     Temp 98.5 F (36.9 C)     Temp Source Oral     SpO2 94 %     Weight 206 lb (93.4 kg)     Height 5' 5.5" (1.664 m)     Head Circumference      Peak Flow      Pain Score 3     Pain Loc      Pain Education      Exclude from Growth Chart    No data found.  Updated Vital  Signs BP 114/73 (BP Location: Right Arm)   Pulse 83   Temp 98.5 F (36.9 C) (Oral)   Resp 16   Ht 5' 5.5" (1.664 m)   Wt 206 lb (93.4 kg)   SpO2 94%   BMI 33.76 kg/m   Visual Acuity Right Eye Distance:   Left Eye Distance:   Bilateral Distance:    Right Eye Near:   Left Eye Near:    Bilateral Near:     Physical Exam Vitals and nursing note reviewed.  Constitutional:      Appearance: Normal appearance.  HENT:     Head: Normocephalic and atraumatic.     Right Ear: External ear normal. There is impacted cerumen.     Left Ear: External ear normal. There is no impacted cerumen.     Nose: Nose normal.     Mouth/Throat:     Mouth: Mucous membranes are moist.  Cardiovascular:     Rate and Rhythm: Normal rate.  Pulmonary:     Effort: Pulmonary effort is normal. No respiratory distress.  Musculoskeletal:        General: Normal range of motion.  Skin:    General: Skin is warm and dry.  Neurological:     General: No focal deficit present.     Mental Status: She is alert and oriented to person, place, and time.  Psychiatric:        Mood and Affect: Mood normal.        Behavior: Behavior normal. Behavior is cooperative.      UC Treatments / Results  Labs (all labs ordered are listed, but only abnormal results are displayed) Labs Reviewed - No data to display  EKG   Radiology No results found.  Procedures Procedures (including critical care time)  Medications Ordered in UC Medications - No data to display  Initial Impression / Assessment and Plan / UC Course  I have reviewed the triage vital signs and the nursing notes.  Pertinent labs & imaging results that were available during my care of the patient were reviewed by me and considered in my medical decision making (see chart for details).  Vitals  and triage reviewed, patient is hemodynamically stable. Has a cerumen impaction of her right ear with some injury to auditory canal, most likely from picking at it  with tweezers.  After irrigation wax has been removed, external auditory canal appears inflamed and irritated, will cover with abx ear drops. POC, f/u care and return precautions given, no questions at this time.      Final Clinical Impressions(s) / UC Diagnoses   Final diagnoses:  Impacted cerumen of right ear     Discharge Instructions      We irrigated some wax out of your right ear.  You have drainage some of the external ear canal, I am going to put you on antibiotic drops to ensure that this does not get infected.  In the future, please do not use tweezers to attempt to remove wax, as this can harm your ear.  You can use the over-the-counter wax softening drops that I have sent in to the pharmacy to help loosen up earwax.  Return to clinic for new or urgent symptoms.     ED Prescriptions     Medication Sig Dispense Auth. Provider   ofloxacin (FLOXIN) 0.3 % OTIC solution Place 10 drops into the right ear daily for 7 days. 5 mL Rinaldo Ratel, [place] N, FNP   carbamide peroxide (DEBROX) 6.5 % OTIC solution Place 5 drops into both ears 2 (two) times daily. 15 mL Ryelan Kazee, [place] N, FNP      PDMP not reviewed this encounter.   Amai Cappiello, [place] N, [place] 08/14/22 [phone]

## 2022-08-14 NOTE — Telephone Encounter (Signed)
Left VM to ensure pt got medications as notification received that e-scripts failed.

## 2022-08-14 NOTE — ED Triage Notes (Signed)
Patient here today with c/o right ear fullness after taking a shower 2 nights ago.

## 2022-09-12 ENCOUNTER — Other Ambulatory Visit: Payer: Self-pay | Admitting: Nurse Practitioner

## 2022-09-12 DIAGNOSIS — K760 Fatty (change of) liver, not elsewhere classified: Secondary | ICD-10-CM

## 2022-09-12 DIAGNOSIS — K7402 Hepatic fibrosis, advanced fibrosis: Secondary | ICD-10-CM

## 2022-09-25 ENCOUNTER — Ambulatory Visit
Admission: RE | Admit: 2022-09-25 | Discharge: 2022-09-25 | Disposition: A | Discharge status: Home / Self Care | Payer: 59 | Source: Ambulatory Visit | Attending: Nurse Practitioner

## 2022-09-25 DIAGNOSIS — K7402 Hepatic fibrosis, advanced fibrosis: Secondary | ICD-10-CM

## 2022-09-25 DIAGNOSIS — K760 Fatty (change of) liver, not elsewhere classified: Secondary | ICD-10-CM

## 2023-03-17 ENCOUNTER — Other Ambulatory Visit: Payer: Self-pay | Admitting: Nurse Practitioner

## 2023-03-17 DIAGNOSIS — K760 Fatty (change of) liver, not elsewhere classified: Secondary | ICD-10-CM

## 2023-03-17 DIAGNOSIS — K7402 Hepatic fibrosis, advanced fibrosis: Secondary | ICD-10-CM

## 2023-03-27 ENCOUNTER — Ambulatory Visit
Admission: RE | Admit: 2023-03-27 | Discharge: 2023-03-27 | Disposition: A | Discharge status: Home / Self Care | Source: Ambulatory Visit | Attending: Nurse Practitioner | Admitting: Nurse Practitioner

## 2023-03-27 DIAGNOSIS — K760 Fatty (change of) liver, not elsewhere classified: Secondary | ICD-10-CM

## 2023-03-27 DIAGNOSIS — K7402 Hepatic fibrosis, advanced fibrosis: Secondary | ICD-10-CM

## 2023-09-22 ENCOUNTER — Other Ambulatory Visit: Payer: Self-pay | Admitting: Nurse Practitioner

## 2023-09-22 DIAGNOSIS — K760 Fatty (change of) liver, not elsewhere classified: Secondary | ICD-10-CM

## 2023-09-22 DIAGNOSIS — K7402 Hepatic fibrosis, advanced fibrosis: Secondary | ICD-10-CM

## 2023-09-24 ENCOUNTER — Encounter: Payer: Self-pay | Admitting: Podiatry

## 2023-09-24 ENCOUNTER — Ambulatory Visit (INDEPENDENT_AMBULATORY_CARE_PROVIDER_SITE_OTHER): Admitting: Podiatry

## 2023-09-24 ENCOUNTER — Ambulatory Visit (INDEPENDENT_AMBULATORY_CARE_PROVIDER_SITE_OTHER)

## 2023-09-24 VITALS — Ht 65.5 in | Wt 206.0 lb

## 2023-09-24 DIAGNOSIS — M795 Residual foreign body in soft tissue: Secondary | ICD-10-CM | POA: Diagnosis not present

## 2023-09-24 DIAGNOSIS — D2371 Other benign neoplasm of skin of right lower limb, including hip: Secondary | ICD-10-CM | POA: Diagnosis not present

## 2023-09-24 NOTE — Progress Notes (Signed)
   Chief Complaint  Patient presents with   Foot Pain    Pt is here due to bilateral foot due to broken glass that may be still in the bottom of the foot, she states that this happen 3 weeks ago and when she walks it feels like something is still in there.    HPI: 63 y.o. female presenting today for new complaint of pain and tenderness associated to the left forefoot.  She says that she stepped on a piece of glass.  She was able to remove a portion of the glass but she continues to have pain in the area.  She has also developed symptomatic skin lesions to the plantar aspect of the fifth MTP of the right foot.  They are very tender especially when walking barefoot  Past Medical History:  Diagnosis Date   Arthritis    Asthma    Hepatitis    C   Hypertension     Past Surgical History:  Procedure Laterality Date   BREAST BIOPSY Left 10/14/2006   x2    No Known Allergies   Physical Exam: General: The patient is alert and oriented x3 in no acute distress.  Dermatology: Skin is warm, dry and supple bilateral lower extremities.  Hyperkeratotic skin lesion noted to the plantar aspect of the fifth MTP right foot with a central nucleated core There is a small pinpoint puncture lesion to the plantar aspect of the left forefoot with surrounding callus tissue.  After debridement of this area there was a small shard of glass that was removed from the area approximately 2 mm in length.  Vascular: Palpable pedal pulses bilaterally. Capillary refill within normal limits.  No appreciable edema.  No erythema.  Neurological: Grossly intact via light touch  Musculoskeletal Exam: No pedal deformities noted  Radiographic Exam:  Normal osseous mineralization. Joint spaces preserved.  No fractures or osseous irregularities noted.  Assessment/Plan of Care: 1.  Foreign body (glass shard) left plantar forefoot 2.  Eccrine poroma fifth MTP right   -Patient evaluated.  X-rays reviewed -Excisional  debridement of the eccrine per almost was performed today using 312 scalpel without incident or bleeding.  Salicylic acid and Band-Aid applied -The foreign body glass shard was identified within the plantar aspect of the left forefoot and carefully removed as well as the surrounding callus tissue without incident.  Patient felt significant relief and no longer had any pain to the area after removal -Advised against going barefoot.  Recommend good supportive tennis shoes and sneakers even around the house -Return to clinic PRN     Thresa EMERSON Sar, DPM [facility]  Dr. Thresa EMERSON Sar, DPM    [address] [place], [place] [address]                Office [phone]  Fax [phone]

## 2023-09-24 NOTE — Patient Instructions (Signed)
Here

## 2023-09-26 ENCOUNTER — Ambulatory Visit
Admission: RE | Admit: 2023-09-26 | Discharge: 2023-09-26 | Disposition: A | Discharge status: Home / Self Care | Source: Ambulatory Visit | Attending: Nurse Practitioner | Admitting: Nurse Practitioner

## 2023-09-26 DIAGNOSIS — K7402 Hepatic fibrosis, advanced fibrosis: Secondary | ICD-10-CM

## 2023-09-26 DIAGNOSIS — K760 Fatty (change of) liver, not elsewhere classified: Secondary | ICD-10-CM

## 2023-12-18 ENCOUNTER — Other Ambulatory Visit: Payer: Self-pay | Admitting: Adult Health Nurse Practitioner

## 2023-12-18 DIAGNOSIS — Z1231 Encounter for screening mammogram for malignant neoplasm of breast: Secondary | ICD-10-CM

## 2024-01-16 ENCOUNTER — Ambulatory Visit

## 2024-02-09 ENCOUNTER — Ambulatory Visit: Admitting: Podiatry

## 2024-02-16 ENCOUNTER — Ambulatory Visit: Admitting: Podiatry

## 2024-02-23 ENCOUNTER — Ambulatory Visit: Admitting: Podiatry
